# Patient Record
Sex: Male | Born: 1970
Health system: Southern US, Community
[De-identification: ages and names within clinical notes are randomized; demographics above are authoritative.]

## PROBLEM LIST (undated history)

## (undated) DIAGNOSIS — A6000 Herpesviral infection of urogenital system, unspecified: Secondary | ICD-10-CM

## (undated) DIAGNOSIS — E7800 Pure hypercholesterolemia, unspecified: Secondary | ICD-10-CM

## (undated) DIAGNOSIS — Z8619 Personal history of other infectious and parasitic diseases: Secondary | ICD-10-CM

## (undated) HISTORY — DX: Herpesviral infection of urogenital system, unspecified: A60.00

## (undated) HISTORY — DX: Personal history of other infectious and parasitic diseases: Z86.19

## (undated) HISTORY — PX: CARPAL TUNNEL WITH CUBITAL TUNNEL: SHX5608

## (undated) HISTORY — PX: VASECTOMY: SHX75

---

## 2017-02-22 ENCOUNTER — Encounter: Payer: Self-pay | Admitting: Internal Medicine

## 2017-02-22 ENCOUNTER — Ambulatory Visit: Payer: 59 | Admitting: Internal Medicine

## 2017-02-22 VITALS — BP 126/88 | HR 73 | Temp 97.9°F | Ht 67.75 in | Wt 210.0 lb

## 2017-02-22 DIAGNOSIS — B029 Zoster without complications: Secondary | ICD-10-CM | POA: Diagnosis not present

## 2017-02-22 DIAGNOSIS — A6001 Herpesviral infection of penis: Secondary | ICD-10-CM | POA: Diagnosis not present

## 2017-02-22 NOTE — Assessment & Plan Note (Signed)
Rare outbreak He is not interested in suppressive therapy at this time

## 2017-02-22 NOTE — Progress Notes (Signed)
HPI  Pt presents to the clinic today to establish care and for management of the conditions listed below. He has not had a PCP in many years.  Genital Herpes: His last outbreak was over 1 year ago. He is not taking Valtrex for suppressive therapy at this time.  He noticed a rash on his left forearm. He noticed this 3-4 weeks ago. The rash itched mildly but did not tingle or burn. He did have exposure to a person with shingles. He uses Calamine lotion with minimal relief.   Flu: never Tetanus: 04/2016 Vision Screening: every 2 years Dentist: as needed   Past Medical History:  Diagnosis Date  . Herpes, genital   . History of chicken pox     Current Outpatient Medications  Medication Sig Dispense Refill  . Cyanocobalamin (VITAMIN B-12) 500 MCG SUBL Place 1 tablet under the tongue daily.    . Multiple Vitamins-Minerals (MULTIVITAMIN ADULT PO) Take 1 tablet by mouth daily.     No current facility-administered medications for this visit.     Allergies  Allergen Reactions  . Penicillins Anaphylaxis    Family History  Problem Relation Age of Onset  . Lung cancer Mother   . Diabetes Father   . Hyperlipidemia Father   . Heart disease Father   . Heart disease Maternal Grandmother   . Breast cancer Maternal Grandmother   . Heart disease Paternal Grandmother   . Diabetes Paternal Grandmother   . Heart disease Paternal Grandfather     Social History   Socioeconomic History  . Marital status: Married    Spouse name: Not on file  . Number of children: Not on file  . Years of education: Not on file  . Highest education level: Not on file  Social Needs  . Financial resource strain: Not on file  . Food insecurity - worry: Not on file  . Food insecurity - inability: Not on file  . Transportation needs - medical: Not on file  . Transportation needs - non-medical: Not on file  Occupational History  . Not on file  Tobacco Use  . Smoking status: Never Smoker  . Smokeless  tobacco: Never Used  Substance and Sexual Activity  . Alcohol use: Yes    Comment: occasional  . Drug use: No  . Sexual activity: Not on file  Other Topics Concern  . Not on file  Social History Narrative  . Not on file    ROS:  Constitutional: Denies fever, malaise, fatigue, headache or abrupt weight changes.  HEENT: Denies eye pain, eye redness, ear pain, ringing in the ears, wax buildup, runny nose, nasal congestion, bloody nose, or sore throat. Respiratory: Denies difficulty breathing, shortness of breath, cough or sputum production.   Cardiovascular: Denies chest pain, chest tightness, palpitations or swelling in the hands or feet.  Gastrointestinal: Denies abdominal pain, bloating, constipation, diarrhea or blood in the stool.  GU: Denies frequency, urgency, pain with urination, blood in urine, odor or discharge. Musculoskeletal: Denies decrease in range of motion, difficulty with gait, muscle pain or joint pain and swelling.  Skin: Pt reports rash, left forearm. Denies ulcercations.  Neurological: Denies dizziness, difficulty with memory, difficulty with speech or problems with balance and coordination.  Psych: Denies anxiety, depression, SI/HI.  No other specific complaints in a complete review of systems (except as listed in HPI above).  PE:  BP 126/88   Pulse 73   Temp 97.9 F (36.6 C) (Oral)   Ht 5' 7.75" (1.721 m)  Wt 210 lb (95.3 kg)   SpO2 98%   BMI 32.17 kg/m   Wt Readings from Last 3 Encounters:  02/22/17 210 lb (95.3 kg)    General: Appears his stated age, obese, in NAD. Skin: Scabbed lesion not on left forearm. He showed me a picture of what it looked like early on. C/w shingles. Cardiovascular: Normal rate and rhythm. S1,S2 noted.  No murmur, rubs or gallops noted.  Pulmonary/Chest: Normal effort and positive vesicular breath sounds. No respiratory distress. No wheezes, rales or ronchi noted.  Neurological: Alert and oriented.  Psychiatric: Mood and  affect normal. Behavior is normal. Judgment and thought content normal.     Assessment and Plan:  Shingles:  Resolving No treatment needed at this time Advised him to consider shingles vaccine at age 47  Make an appt for your annual exam Nicki ReaperBAITY, Shakeeta Godette, NP

## 2017-02-22 NOTE — Patient Instructions (Signed)

## 2017-03-13 ENCOUNTER — Ambulatory Visit (INDEPENDENT_AMBULATORY_CARE_PROVIDER_SITE_OTHER): Payer: 59 | Admitting: Internal Medicine

## 2017-03-13 ENCOUNTER — Encounter: Payer: Self-pay | Admitting: Internal Medicine

## 2017-03-13 VITALS — BP 124/76 | HR 76 | Temp 97.9°F | Ht 67.75 in | Wt 210.0 lb

## 2017-03-13 DIAGNOSIS — Z114 Encounter for screening for human immunodeficiency virus [HIV]: Secondary | ICD-10-CM | POA: Diagnosis not present

## 2017-03-13 DIAGNOSIS — Z23 Encounter for immunization: Secondary | ICD-10-CM | POA: Diagnosis not present

## 2017-03-13 DIAGNOSIS — Z0000 Encounter for general adult medical examination without abnormal findings: Secondary | ICD-10-CM

## 2017-03-13 LAB — LIPID PANEL
CHOLESTEROL: 287 mg/dL — AB (ref 0–200)
HDL: 45.9 mg/dL (ref >39.00)
LDL Cholesterol: 219 mg/dL — ABNORMAL HIGH (ref 0–99)
NONHDL: 241.47
Total CHOL/HDL Ratio: 6
Triglycerides: 112 mg/dL (ref 0.0–149.0)
VLDL: 22.4 mg/dL (ref 0.0–40.0)

## 2017-03-13 LAB — CBC
HCT: 48.2 % (ref 39.0–52.0)
Hemoglobin: 16.5 g/dL (ref 13.0–17.0)
MCHC: 34.3 g/dL (ref 30.0–36.0)
MCV: 89.3 fl (ref 78.0–100.0)
Platelets: 231 10*3/uL (ref 150.0–400.0)
RBC: 5.4 Mil/uL (ref 4.22–5.81)
RDW: 13.3 % (ref 11.5–15.5)
WBC: 6.6 10*3/uL (ref 4.0–10.5)

## 2017-03-13 LAB — COMPREHENSIVE METABOLIC PANEL
ALBUMIN: 4.5 g/dL (ref 3.5–5.2)
ALK PHOS: 64 U/L (ref 39–117)
ALT: 31 U/L (ref 0–53)
AST: 24 U/L (ref 0–37)
BILIRUBIN TOTAL: 0.6 mg/dL (ref 0.2–1.2)
BUN: 14 mg/dL (ref 6–23)
CO2: 30 mEq/L (ref 19–32)
Calcium: 10.2 mg/dL (ref 8.4–10.5)
Chloride: 102 mEq/L (ref 96–112)
Creatinine, Ser: 0.99 mg/dL (ref 0.40–1.50)
GFR: 85.9 mL/min (ref >60.00)
GLUCOSE: 108 mg/dL — AB (ref 70–99)
Potassium: 4.4 mEq/L (ref 3.5–5.1)
SODIUM: 138 meq/L (ref 135–145)
TOTAL PROTEIN: 7.8 g/dL (ref 6.0–8.3)

## 2017-03-13 LAB — HEMOGLOBIN A1C: HEMOGLOBIN A1C: 5.5 % (ref 4.6–6.5)

## 2017-03-13 NOTE — Progress Notes (Signed)
Subjective:    Patient ID: Joseph Salazar, male    DOB: 01/16/1971, 47 y.o.   MRN: [id number]  HPI  Pt presents to the clinic today for his annual exam.  Flu: never Tetanus: 03/2016 Vision Screening: as needed Dentist: as needed  Diet: He does eat meat. He consumes some fruits and veggies. He does eat fried foods. He drinks mostly water and coffee. Exercise: None  Review of Systems      Past Medical History:  Diagnosis Date  . Herpes, genital   . History of chicken pox     Current Outpatient Medications  Medication Sig Dispense Refill  . Cyanocobalamin (VITAMIN B-12) 500 MCG SUBL Place 1 tablet under the tongue daily.    . Multiple Vitamins-Minerals (MULTIVITAMIN ADULT PO) Take 1 tablet by mouth daily.     No current facility-administered medications for this visit.     Allergies  Allergen Reactions  . Penicillins Anaphylaxis    Family History  Problem Relation Age of Onset  . Lung cancer Mother   . Diabetes Father   . Hyperlipidemia Father   . Heart disease Father   . Heart disease Maternal Grandmother   . Breast cancer Maternal Grandmother   . Heart disease Paternal Grandmother   . Diabetes Paternal Grandmother   . Heart disease Paternal Grandfather     Social History   Socioeconomic History  . Marital status: Married    Spouse name: Not on file  . Number of children: Not on file  . Years of education: Not on file  . Highest education level: Not on file  Social Needs  . Financial resource strain: Not on file  . Food insecurity - worry: Not on file  . Food insecurity - inability: Not on file  . Transportation needs - medical: Not on file  . Transportation needs - non-medical: Not on file  Occupational History  . Not on file  Tobacco Use  . Smoking status: Never Smoker  . Smokeless tobacco: Never Used  Substance and Sexual Activity  . Alcohol use: Yes    Comment: occasional  . Drug use: No  . Sexual activity: Not on file  Other Topics  Concern  . Not on file  Social History Narrative  . Not on file     Constitutional: Denies fever, malaise, fatigue, headache or abrupt weight changes.  HEENT: Denies eye pain, eye redness, ear pain, ringing in the ears, wax buildup, runny nose, nasal congestion, bloody nose, or sore throat. Respiratory: Denies difficulty breathing, shortness of breath, cough or sputum production.   Cardiovascular: Denies chest pain, chest tightness, palpitations or swelling in the hands or feet.  Gastrointestinal: Denies abdominal pain, bloating, constipation, diarrhea or blood in the stool.  GU: Denies urgency, frequency, pain with urination, burning sensation, blood in urine, odor or discharge. Musculoskeletal: Denies decrease in range of motion, difficulty with gait, muscle pain or joint pain and swelling.  Skin: Denies redness, rashes, lesions or ulcercations.  Neurological: Denies dizziness, difficulty with memory, difficulty with speech or problems with balance and coordination.  Psych: Denies anxiety, depression, SI/HI.  No other specific complaints in a complete review of systems (except as listed in HPI above).  Objective:   Physical Exam   BP 124/76   Pulse 76   Temp 97.9 F (36.6 C) (Oral)   Ht 5' 7.75" (1.721 m)   Wt 210 lb (95.3 kg)   SpO2 98%   BMI 32.17 kg/m  Wt Readings from Last  3 Encounters:  03/13/17 210 lb (95.3 kg)  02/22/17 210 lb (95.3 kg)    General: Appears his stated age, obese in NAD. Skin: Warm, dry and intact.  HEENT: Head: normal shape and size; Eyes: sclera white, no icterus, conjunctiva pink, PERRLA and EOMs intact; Ears: Tm's gray and intact, normal light reflex; Nose: mucosa pink and moist, septum midline; Throat/Mouth: Teeth present, mucosa pink and moist, no exudate, lesions or ulcerations noted.  Neck:  Neck supple, trachea midline. No masses, lumps or thyromegaly present.  Cardiovascular: Normal rate and rhythm. S1,S2 noted.  No murmur, rubs or gallops  noted. No JVD or BLE edema.  Pulmonary/Chest: Normal effort and positive vesicular breath sounds. No respiratory distress. No wheezes, rales or ronchi noted.  Abdomen: Soft and nontender. Normal bowel sounds. No distention or masses noted. Liver, spleen and kidneys non palpable. Musculoskeletal: Strength 5/5 BUE/BLE. No difficulty with gait.  Neurological: Alert and oriented. Cranial nerves II-XII grossly intact. Coordination normal.  Psychiatric: Mood and affect normal. Behavior is normal. Judgment and thought content normal.           Assessment & Plan:   Preventative Health Maintenance:  Flu shot today Tetanus UTD Encouraged him to consume a balanced diet an exercise regimen Advised him to see an eye doctor and dentist annually Will check CBC, CMET, Lipid, A1C and Vit D today  RTC in 1 year for your annual exam Nicki Reaper, NP

## 2017-03-13 NOTE — Addendum Note (Signed)
Addended by: Roena MaladyEVONTENNO, Muhamed Luecke Y on: 03/13/2017 03:44 PM   Modules accepted: Orders

## 2017-03-13 NOTE — Patient Instructions (Signed)

## 2017-03-14 LAB — HIV ANTIBODY (ROUTINE TESTING W REFLEX): HIV 1&2 Ab, 4th Generation: NONREACTIVE

## 2017-03-19 ENCOUNTER — Other Ambulatory Visit: Payer: Self-pay | Admitting: Internal Medicine

## 2017-03-19 DIAGNOSIS — E7800 Pure hypercholesterolemia, unspecified: Secondary | ICD-10-CM

## 2017-03-19 MED ORDER — ATORVASTATIN CALCIUM 20 MG PO TABS
20.0000 mg | ORAL_TABLET | Freq: Every day | ORAL | 1 refills | Status: DC
Start: 2017-03-19 — End: 2017-09-23

## 2017-07-24 DIAGNOSIS — L299 Pruritus, unspecified: Secondary | ICD-10-CM | POA: Diagnosis not present

## 2017-07-24 DIAGNOSIS — Z8619 Personal history of other infectious and parasitic diseases: Secondary | ICD-10-CM | POA: Diagnosis not present

## 2017-07-24 DIAGNOSIS — L0100 Impetigo, unspecified: Secondary | ICD-10-CM | POA: Diagnosis not present

## 2017-07-24 DIAGNOSIS — L259 Unspecified contact dermatitis, unspecified cause: Secondary | ICD-10-CM | POA: Diagnosis not present

## 2017-09-23 ENCOUNTER — Other Ambulatory Visit: Payer: Self-pay | Admitting: Internal Medicine

## 2017-09-23 DIAGNOSIS — E7800 Pure hypercholesterolemia, unspecified: Secondary | ICD-10-CM

## 2017-12-21 ENCOUNTER — Other Ambulatory Visit: Payer: Self-pay | Admitting: Internal Medicine

## 2017-12-21 DIAGNOSIS — E7800 Pure hypercholesterolemia, unspecified: Secondary | ICD-10-CM

## 2018-03-21 ENCOUNTER — Encounter: Payer: 59 | Admitting: Internal Medicine

## 2018-03-26 ENCOUNTER — Encounter: Payer: Self-pay | Admitting: Internal Medicine

## 2018-03-26 ENCOUNTER — Ambulatory Visit (INDEPENDENT_AMBULATORY_CARE_PROVIDER_SITE_OTHER): Payer: 59 | Admitting: Internal Medicine

## 2018-03-26 VITALS — BP 124/82 | HR 66 | Temp 97.8°F | Ht 68.0 in | Wt 217.0 lb

## 2018-03-26 DIAGNOSIS — Z0001 Encounter for general adult medical examination with abnormal findings: Secondary | ICD-10-CM | POA: Diagnosis not present

## 2018-03-26 DIAGNOSIS — E7800 Pure hypercholesterolemia, unspecified: Secondary | ICD-10-CM

## 2018-03-26 DIAGNOSIS — E785 Hyperlipidemia, unspecified: Secondary | ICD-10-CM | POA: Insufficient documentation

## 2018-03-26 LAB — CBC
HCT: 48.5 % (ref 39.0–52.0)
HEMOGLOBIN: 16.7 g/dL (ref 13.0–17.0)
MCHC: 34.5 g/dL (ref 30.0–36.0)
MCV: 89.8 fl (ref 78.0–100.0)
PLATELETS: 243 10*3/uL (ref 150.0–400.0)
RBC: 5.4 Mil/uL (ref 4.22–5.81)
RDW: 13.7 % (ref 11.5–15.5)
WBC: 7 10*3/uL (ref 4.0–10.5)

## 2018-03-26 LAB — LIPID PANEL
Cholesterol: 214 mg/dL — ABNORMAL HIGH (ref 0–200)
HDL: 47.9 mg/dL (ref >39.00)
LDL Cholesterol: 144 mg/dL — ABNORMAL HIGH (ref 0–99)
NONHDL: 166.54
TRIGLYCERIDES: 113 mg/dL (ref 0.0–149.0)
Total CHOL/HDL Ratio: 4
VLDL: 22.6 mg/dL (ref 0.0–40.0)

## 2018-03-26 LAB — COMPREHENSIVE METABOLIC PANEL
ALK PHOS: 71 U/L (ref 39–117)
ALT: 41 U/L (ref 0–53)
AST: 26 U/L (ref 0–37)
Albumin: 4.7 g/dL (ref 3.5–5.2)
BILIRUBIN TOTAL: 0.6 mg/dL (ref 0.2–1.2)
BUN: 22 mg/dL (ref 6–23)
CALCIUM: 9.4 mg/dL (ref 8.4–10.5)
CO2: 27 mEq/L (ref 19–32)
CREATININE: 1 mg/dL (ref 0.40–1.50)
Chloride: 104 mEq/L (ref 96–112)
GFR: 79.54 mL/min (ref >60.00)
GLUCOSE: 95 mg/dL (ref 70–99)
Potassium: 4.6 mEq/L (ref 3.5–5.1)
Sodium: 137 mEq/L (ref 135–145)
TOTAL PROTEIN: 7.7 g/dL (ref 6.0–8.3)

## 2018-03-26 LAB — HEMOGLOBIN A1C: Hgb A1c MFr Bld: 5.5 % (ref 4.6–6.5)

## 2018-03-26 NOTE — Patient Instructions (Signed)

## 2018-03-26 NOTE — Assessment & Plan Note (Signed)
CMET and lipid profile today Encouraged him to consume a low fat diet Continue Atorvastatin, will adjust if needed based on labs

## 2018-03-26 NOTE — Progress Notes (Addendum)
Subjective:    Patient ID: Joseph Salazar, male    DOB: April 25, 1970, 48 y.o.   MRN: [id number]  HPI  Pt presents to the clinic today for his annual exam.  HLD: His last LDL was 219, 02/2017. He denies myalgias on Atorvastatin. He tries to consume a low fat diet.  Flu: 02/2017 Tetanus: 03/2016 Vision Screening: every 1-2 years Dentist: as needed  Diet: He does eat meat. He consumes some fruits and veggies daily. He tries to avoid fried foods. He drinks mostly coffee and water. Exercise: None  Review of Systems .  Past Medical History:  Diagnosis Date  . Herpes, genital   . History of chicken pox     Current Outpatient Medications  Medication Sig Dispense Refill  . atorvastatin (LIPITOR) 20 MG tablet Take 1 tablet (20 mg total) by mouth daily. 90 tablet 0  . Cyanocobalamin (VITAMIN B-12) 500 MCG SUBL Place 1 tablet under the tongue daily.    . Multiple Vitamins-Minerals (MULTIVITAMIN ADULT PO) Take 1 tablet by mouth daily.     No current facility-administered medications for this visit.     Allergies  Allergen Reactions  . Penicillins Anaphylaxis    Family History  Problem Relation Age of Onset  . Lung cancer Mother   . Diabetes Father   . Hyperlipidemia Father   . Heart disease Father   . Heart disease Maternal Grandmother   . Breast cancer Maternal Grandmother   . Heart disease Paternal Grandmother   . Diabetes Paternal Grandmother   . Heart disease Paternal Grandfather     Social History   Socioeconomic History  . Marital status: Married    Spouse name: Not on file  . Number of children: Not on file  . Years of education: Not on file  . Highest education level: Not on file  Occupational History  . Not on file  Social Needs  . Financial resource strain: Not on file  . Food insecurity:    Worry: Not on file    Inability: Not on file  . Transportation needs:    Medical: Not on file    Non-medical: Not on file  Tobacco Use  . Smoking status:  Never Smoker  . Smokeless tobacco: Never Used  Substance and Sexual Activity  . Alcohol use: Yes    Comment: occasional  . Drug use: No  . Sexual activity: Not on file  Lifestyle  . Physical activity:    Days per week: Not on file    Minutes per session: Not on file  . Stress: Not on file  Relationships  . Social connections:    Talks on phone: Not on file    Gets together: Not on file    Attends religious service: Not on file    Active member of club or organization: Not on file    Attends meetings of clubs or organizations: Not on file    Relationship status: Not on file  . Intimate partner violence:    Fear of current or ex partner: Not on file    Emotionally abused: Not on file    Physically abused: Not on file    Forced sexual activity: Not on file  Other Topics Concern  . Not on file  Social History Narrative  . Not on file     Constitutional: Denies fever, malaise, fatigue, headache or abrupt weight changes.  HEENT: Denies eye pain, eye redness, ear pain, ringing in the ears, wax buildup, runny nose, nasal  congestion, bloody nose, or sore throat. Respiratory: Denies difficulty breathing, shortness of breath, cough or sputum production.   Cardiovascular: Denies chest pain, chest tightness, palpitations or swelling in the hands or feet.  Gastrointestinal: Denies abdominal pain, bloating, constipation, diarrhea or blood in the stool.  GU: Denies urgency, frequency, pain with urination, burning sensation, blood in urine, odor or discharge. Musculoskeletal: Denies decrease in range of motion, difficulty with gait, muscle pain or joint pain and swelling.  Skin: Denies redness, rashes, lesions or ulcercations.  Neurological: Denies dizziness, difficulty with memory, difficulty with speech or problems with balance and coordination.  Psych: Denies anxiety, depression, SI/HI.  No other specific complaints in a complete review of systems (except as listed in HPI  above).  Objective:   Physical Exam  BP 124/82   Pulse 66   Temp 97.8 F (36.6 C) (Oral)   Ht 5\' 8"  (1.727 m)   Wt 217 lb (98.4 kg)   SpO2 98%   BMI 32.99 kg/m  Wt Readings from Last 3 Encounters:  03/26/18 217 lb (98.4 kg)  03/13/17 210 lb (95.3 kg)  02/22/17 210 lb (95.3 kg)    General: Appears their stated age, well developed, well nourished in NAD. Skin: Warm, dry and intact. Dermatofibroma noted of right thigh. HEENT: Head: normal shape and size; Eyes: sclera white, no icterus, conjunctiva pink, PERRLA and EOMs intact; Ears: Tm's gray and intact, normal light reflex; Nose: mucosa pink and moist, septum midline; Throat/Mouth: Teeth present, mucosa pink and moist, no exudate, lesions or ulcerations noted.  Neck:  Neck supple, trachea midline. No masses, lumps or thyromegaly present.  Cardiovascular: Normal rate and rhythm. S1,S2 noted.  No murmur, rubs or gallops noted. No JVD or BLE edema. No carotid bruits noted. Pulmonary/Chest: Normal effort and positive vesicular breath sounds. No respiratory distress. No wheezes, rales or ronchi noted.  Abdomen: Soft and nontender. Normal bowel sounds. No distention or masses noted. Liver, spleen and kidneys non palpable. Musculoskeletal: Normal range of motion. No signs of joint swelling. No difficulty with gait.  Neurological: Alert and oriented. Cranial nerves II-XII grossly intact. Coordination normal.  Psychiatric: Mood and affect normal. Behavior is normal. Judgment and thought content normal.   EKG:  BMET    Component Value Date/Time   NA 138 03/13/2017 0948   K 4.4 03/13/2017 0948   CL 102 03/13/2017 0948   CO2 30 03/13/2017 0948   GLUCOSE 108 (H) 03/13/2017 0948   BUN 14 03/13/2017 0948   CREATININE 0.99 03/13/2017 0948   CALCIUM 10.2 03/13/2017 0948    Lipid Panel     Component Value Date/Time   CHOL 287 (H) 03/13/2017 0948   TRIG 112.0 03/13/2017 0948   HDL 45.90 03/13/2017 0948   CHOLHDL 6 03/13/2017 0948    VLDL 22.4 03/13/2017 0948   LDLCALC 219 (H) 03/13/2017 0948    CBC    Component Value Date/Time   WBC 6.6 03/13/2017 0948   RBC 5.40 03/13/2017 0948   HGB 16.5 03/13/2017 0948   HCT 48.2 03/13/2017 0948   PLT 231.0 03/13/2017 0948   MCV 89.3 03/13/2017 0948   MCHC 34.3 03/13/2017 0948   RDW 13.3 03/13/2017 0948    Hgb A1C Lab Results  Component Value Date   HGBA1C 5.5 03/13/2017            Assessment & Plan:

## 2018-03-27 MED ORDER — TRIAMCINOLONE ACETONIDE 0.1 % EX CREA: 1.0000 "application " | TOPICAL_CREAM | Freq: Two times a day (BID) | CUTANEOUS | 0 refills | Status: DC

## 2018-03-29 ENCOUNTER — Other Ambulatory Visit: Payer: Self-pay | Admitting: Internal Medicine

## 2018-03-29 DIAGNOSIS — E7800 Pure hypercholesterolemia, unspecified: Secondary | ICD-10-CM

## 2018-04-02 ENCOUNTER — Encounter: Payer: Self-pay | Admitting: Internal Medicine

## 2018-04-02 MED ORDER — ATORVASTATIN CALCIUM 40 MG PO TABS: 40.0000 mg | ORAL_TABLET | Freq: Every day | ORAL | 0 refills | Status: DC

## 2018-04-11 NOTE — Addendum Note (Signed)
Addended by: Roena Malady on: 04/11/2018 04:51 PM   Modules accepted: Orders

## 2018-07-06 ENCOUNTER — Other Ambulatory Visit: Payer: Self-pay | Admitting: Internal Medicine

## 2018-07-28 ENCOUNTER — Other Ambulatory Visit: Payer: Self-pay | Admitting: Internal Medicine

## 2018-10-03 ENCOUNTER — Other Ambulatory Visit: Payer: Self-pay | Admitting: Internal Medicine

## 2018-10-04 NOTE — Telephone Encounter (Signed)
Patient stated that he has one tablet. He stated it was no rush but would like to sent in when you can

## 2018-10-08 ENCOUNTER — Other Ambulatory Visit (INDEPENDENT_AMBULATORY_CARE_PROVIDER_SITE_OTHER): Payer: 59

## 2018-10-08 DIAGNOSIS — E7800 Pure hypercholesterolemia, unspecified: Secondary | ICD-10-CM

## 2018-10-09 LAB — LIPID PANEL
Cholesterol: 197 mg/dL (ref 0–200)
HDL: 45.1 mg/dL (ref >39.00)
LDL Cholesterol: 122 mg/dL — ABNORMAL HIGH (ref 0–99)
NonHDL: 151.66
Total CHOL/HDL Ratio: 4
Triglycerides: 150 mg/dL — ABNORMAL HIGH (ref 0.0–149.0)
VLDL: 30 mg/dL (ref 0.0–40.0)

## 2018-10-23 ENCOUNTER — Encounter: Payer: Self-pay | Admitting: Internal Medicine

## 2018-10-24 MED ORDER — ATORVASTATIN CALCIUM 40 MG PO TABS: 40.0000 mg | ORAL_TABLET | Freq: Every day | ORAL | 1 refills | Status: DC

## 2019-02-11 ENCOUNTER — Telehealth: Payer: Self-pay | Admitting: Internal Medicine

## 2019-02-11 NOTE — Telephone Encounter (Signed)
Patient came in to the office to request refill and to change pharmacy. Patient would like to use the [facility] [address] in [place].

## 2019-02-12 MED ORDER — TRIAMCINOLONE ACETONIDE 0.1 % EX CREA: TOPICAL_CREAM | CUTANEOUS | 0 refills | Status: DC

## 2019-02-12 NOTE — Telephone Encounter (Signed)
Atorvastatin had a refill and Kenalog refill sent to the pharmacy

## 2019-03-31 ENCOUNTER — Encounter: Payer: 59 | Admitting: Internal Medicine

## 2019-04-09 ENCOUNTER — Encounter: Payer: Self-pay | Admitting: Internal Medicine

## 2019-04-09 NOTE — Telephone Encounter (Signed)
Message sent back to patient advising to call the office for an appt or to discuss concerns  With triage.   Will await message back from patient or phone call - in meantime will send this message to [facility] to follow up.

## 2019-05-05 ENCOUNTER — Encounter: Payer: 59 | Admitting: Internal Medicine

## 2019-05-12 ENCOUNTER — Other Ambulatory Visit: Payer: Self-pay

## 2019-05-12 ENCOUNTER — Encounter: Payer: Self-pay | Admitting: Internal Medicine

## 2019-05-12 ENCOUNTER — Ambulatory Visit (INDEPENDENT_AMBULATORY_CARE_PROVIDER_SITE_OTHER): Payer: Commercial Managed Care - PPO | Admitting: Internal Medicine

## 2019-05-12 VITALS — BP 120/84 | HR 93 | Temp 97.8°F | Ht 67.75 in | Wt 205.0 lb

## 2019-05-12 DIAGNOSIS — R251 Tremor, unspecified: Secondary | ICD-10-CM | POA: Diagnosis not present

## 2019-05-12 DIAGNOSIS — Z0000 Encounter for general adult medical examination without abnormal findings: Secondary | ICD-10-CM | POA: Diagnosis not present

## 2019-05-12 DIAGNOSIS — M8949 Other hypertrophic osteoarthropathy, multiple sites: Secondary | ICD-10-CM

## 2019-05-12 DIAGNOSIS — Z0001 Encounter for general adult medical examination with abnormal findings: Secondary | ICD-10-CM | POA: Diagnosis not present

## 2019-05-12 DIAGNOSIS — Z1211 Encounter for screening for malignant neoplasm of colon: Secondary | ICD-10-CM

## 2019-05-12 DIAGNOSIS — Z125 Encounter for screening for malignant neoplasm of prostate: Secondary | ICD-10-CM

## 2019-05-12 DIAGNOSIS — M159 Polyosteoarthritis, unspecified: Secondary | ICD-10-CM

## 2019-05-12 MED ORDER — TAMSULOSIN HCL 0.4 MG PO CAPS: 0.4000 mg | ORAL_CAPSULE | Freq: Every day | ORAL | 3 refills | Status: DC

## 2019-05-12 NOTE — Patient Instructions (Signed)

## 2019-05-12 NOTE — Progress Notes (Signed)
Subjective:    Patient ID: Joseph Salazar, male    DOB: [birth year], 49 y.o.   MRN: [id number]  HPI  Pt presents to the clinic today for his annual exam.  HLD: His last LDL was 122, triglycerides 150, 09/2018. He denies myalgias on Atorvastatin. He tries to consume a low fat diet.  He also reports tremors in his left hand. He noticed this 6-8 months ago. He has no family history of Parkinson's disease. He has not tried anything OTC for this.  He also reports bilateral ankle pain, L>R. He sprained his ankles 5-6 years ago.   Flu: 02/2017 Tetanus: 03/2016 PSA Screening: never Colon Screening: never Vision Screening: every 1-2 years Dentist:  As needed  Diet: He does eat meat. He consumes fruits and veggies daily. He tries to avoid fried foods. He drinks mostly water, coffee. Exercise: None Review of Systems      Past Medical History:  Diagnosis Date  . Herpes, genital   . History of chicken pox     Current Outpatient Medications  Medication Sig Dispense Refill  . atorvastatin (LIPITOR) 40 MG tablet Take 1 tablet (40 mg total) by mouth daily. 90 tablet 1  . Cyanocobalamin (VITAMIN B-12) 500 MCG SUBL Place 1 tablet under the tongue daily.    . Multiple Vitamins-Minerals (MULTIVITAMIN ADULT PO) Take 1 tablet by mouth daily.    Marland Kitchen triamcinolone cream (KENALOG) 0.1 % APPLY TO AFFECTED AREA TWICE A DAY 30 g 0   No current facility-administered medications for this visit.    Allergies  Allergen Reactions  . Penicillins Anaphylaxis    Family History  Problem Relation Age of Onset  . Lung cancer Mother   . Diabetes Father   . Hyperlipidemia Father   . Heart disease Father   . Heart disease Maternal Grandmother   . Breast cancer Maternal Grandmother   . Heart disease Paternal Grandmother   . Diabetes Paternal Grandmother   . Heart disease Paternal Grandfather     Social History   Socioeconomic History  . Marital status: Married    Spouse name: Not on file  .  Number of children: Not on file  . Years of education: Not on file  . Highest education level: Not on file  Occupational History  . Not on file  Tobacco Use  . Smoking status: Never Smoker  . Smokeless tobacco: Never Used  Substance and Sexual Activity  . Alcohol use: Yes    Comment: occasional  . Drug use: No  . Sexual activity: Not on file  Other Topics Concern  . Not on file  Social History Narrative  . Not on file   Social Determinants of Health   Financial Resource Strain:   . Difficulty of Paying Living Expenses:   Food Insecurity:   . Worried About Programme researcher, broadcasting/film/video in the Last Year:   . Barista in the Last Year:   Transportation Needs:   . Freight forwarder (Medical):   Marland Kitchen Lack of Transportation (Non-Medical):   Physical Activity:   . Days of Exercise per Week:   . Minutes of Exercise per Session:   Stress:   . Feeling of Stress :   Social Connections:   . Frequency of Communication with Friends and Family:   . Frequency of Social Gatherings with Friends and Family:   . Attends Religious Services:   . Active Member of Clubs or Organizations:   . Attends Banker Meetings:   .  Marital Status:   Intimate Partner Violence:   . Fear of Current or Ex-Partner:   . Emotionally Abused:   Marland Kitchen Physically Abused:   . Sexually Abused:      Constitutional: Denies fever, malaise, fatigue, headache or abrupt weight changes.  HEENT: Denies eye pain, eye redness, ear pain, ringing in the ears, wax buildup, runny nose, nasal congestion, bloody nose, or sore throat. Respiratory: Denies difficulty breathing, shortness of breath, cough or sputum production.   Cardiovascular: Denies chest pain, chest tightness, palpitations or swelling in the hands or feet.  Gastrointestinal: Denies abdominal pain, bloating, constipation, diarrhea or blood in the stool.  GU: Denies urgency, frequency, pain with urination, burning sensation, blood in urine, odor or  discharge. Musculoskeletal: Pt reports bilateral ankle and hand pain intermittently. Denies decrease in range of motion, difficulty with gait, muscle pain or joint swelling.  Skin: Denies redness, rashes, lesions or ulcercations.  Neurological: Pt reports tremor of left hand. Denies dizziness, difficulty with memory, difficulty with speech or problems with balance and coordination.  Psych: Denies anxiety, depression, SI/HI.  No other specific complaints in a complete review of systems (except as listed in HPI above).  Objective:   Physical Exam  BP 120/84   Pulse 93   Temp 97.8 F (36.6 C) (Temporal)   Ht 5' 7.75" (1.721 m)   Wt 205 lb (93 kg)   SpO2 97%   BMI 31.40 kg/m    Wt Readings from Last 3 Encounters:  03/26/18 217 lb (98.4 kg)  03/13/17 210 lb (95.3 kg)  02/22/17 210 lb (95.3 kg)    General: Appears his stated age, obese, in NAD. Skin: Warm, dry and intact. No rashesoted. HEENT: Head: normal shape and size; Eyes: sclera white, no icterus, conjunctiva pink, PERRLA and EOMs intact; Neck:  Neck supple, trachea midline. No masses, lumps or thyromegaly present.  Cardiovascular: Normal rate and rhythm. S1,S2 noted.  No murmur, rubs or gallops noted. No JVD or BLE edema. No carotid bruits noted. Pulmonary/Chest: Normal effort and positive vesicular breath sounds. No respiratory distress. No wheezes, rales or ronchi noted.  Abdomen: Soft and nontender. Normal bowel sounds. No distention or masses noted. Liver, spleen and kidneys non palpable. Musculoskeletal: Strength 5/5 BUE/BLE. No joint swelling noted. No difficulty with gait.  Neurological: Alert and oriented. Tremor noted of left hand.  Cranial nerves II-XII grossly intact. Coordination normal.  Psychiatric: Mood and affect normal. Behavior is normal. Judgment and thought content normal.    BMET    Component Value Date/Time   NA 137 03/26/2018 1011   K 4.6 03/26/2018 1011   CL 104 03/26/2018 1011   CO2 27  03/26/2018 1011   GLUCOSE 95 03/26/2018 1011   BUN 22 03/26/2018 1011   CREATININE 1.00 03/26/2018 1011   CALCIUM 9.4 03/26/2018 1011    Lipid Panel     Component Value Date/Time   CHOL 197 10/08/2018 1557   TRIG 150.0 (H) 10/08/2018 1557   HDL 45.10 10/08/2018 1557   CHOLHDL 4 10/08/2018 1557   VLDL 30.0 10/08/2018 1557   LDLCALC 122 (H) 10/08/2018 1557    CBC    Component Value Date/Time   WBC 7.0 03/26/2018 1011   RBC 5.40 03/26/2018 1011   HGB 16.7 03/26/2018 1011   HCT 48.5 03/26/2018 1011   PLT 243.0 03/26/2018 1011   MCV 89.8 03/26/2018 1011   MCHC 34.5 03/26/2018 1011   RDW 13.7 03/26/2018 1011    Hgb A1C Lab Results  Component  Value Date   HGBA1C 5.5 03/26/2018           Assessment & Plan:   Preventative Health Maintenance:  Encouraged him to get a flu shot in the fall Tetanus UTD Referral to GI for screening colonoscopy Encouraged him to consume a balanced diet and exercise regimen Advised him to see an eye doctor and dentist annually Will check CBC, CMET, Lipid, and PSA today  Tremor Left Hand:  Likely essential tremor- discussed medication management  He would prefer referral to neuro to r/o other conditions like Parkinson's  Bilateral Ankle and Hand Pain:  Likely OA Can try Tumeric OTC for inflammation and pain  RTC in 1 year, sooner if needed   Webb Silversmith, NP This visit occurred during the SARS-CoV-2 public health emergency.  Safety protocols were in place, including screening questions prior to the visit, additional usage of staff PPE, and extensive cleaning of exam room while observing appropriate contact time as indicated for disinfecting solutions.

## 2019-05-13 LAB — COMPREHENSIVE METABOLIC PANEL
ALT: 25 U/L (ref 0–53)
AST: 25 U/L (ref 0–37)
Albumin: 4.7 g/dL (ref 3.5–5.2)
Alkaline Phosphatase: 80 U/L (ref 39–117)
BUN: 13 mg/dL (ref 6–23)
CO2: 30 mEq/L (ref 19–32)
Calcium: 9.3 mg/dL (ref 8.4–10.5)
Chloride: 103 mEq/L (ref 96–112)
Creatinine, Ser: 1.15 mg/dL (ref 0.40–1.50)
GFR: 67.38 mL/min (ref >60.00)
Glucose, Bld: 80 mg/dL (ref 70–99)
Potassium: 4.2 mEq/L (ref 3.5–5.1)
Sodium: 140 mEq/L (ref 135–145)
Total Bilirubin: 0.8 mg/dL (ref 0.2–1.2)
Total Protein: 7.2 g/dL (ref 6.0–8.3)

## 2019-05-13 LAB — LIPID PANEL
Cholesterol: 181 mg/dL (ref 0–200)
HDL: 39.5 mg/dL (ref >39.00)
LDL Cholesterol: 105 mg/dL — ABNORMAL HIGH (ref 0–99)
NonHDL: 141.72
Total CHOL/HDL Ratio: 5
Triglycerides: 183 mg/dL — ABNORMAL HIGH (ref 0.0–149.0)
VLDL: 36.6 mg/dL (ref 0.0–40.0)

## 2019-05-13 LAB — CBC
HCT: 47.4 % (ref 39.0–52.0)
Hemoglobin: 16.1 g/dL (ref 13.0–17.0)
MCHC: 34.1 g/dL (ref 30.0–36.0)
MCV: 91.5 fl (ref 78.0–100.0)
Platelets: 243 10*3/uL (ref 150.0–400.0)
RBC: 5.18 Mil/uL (ref 4.22–5.81)
RDW: 13.3 % (ref 11.5–15.5)
WBC: 8.1 10*3/uL (ref 4.0–10.5)

## 2019-05-13 LAB — PSA: PSA: 1.56 ng/mL (ref 0.10–4.00)

## 2019-05-19 ENCOUNTER — Other Ambulatory Visit: Payer: Self-pay

## 2019-05-19 ENCOUNTER — Telehealth (INDEPENDENT_AMBULATORY_CARE_PROVIDER_SITE_OTHER): Payer: Self-pay | Admitting: Gastroenterology

## 2019-05-19 DIAGNOSIS — Z1211 Encounter for screening for malignant neoplasm of colon: Secondary | ICD-10-CM

## 2019-05-19 DIAGNOSIS — S93409A Sprain of unspecified ligament of unspecified ankle, initial encounter: Secondary | ICD-10-CM | POA: Insufficient documentation

## 2019-05-19 NOTE — Progress Notes (Signed)
Gastroenterology Pre-Procedure Review  Request Date: Monday 06/09/19 Requesting Physician: Dr. Tobi Bastos  PATIENT REVIEW QUESTIONS: The patient responded to the following health history questions as indicated:    1. Are you having any GI issues? no 2. Do you have a personal history of Polyps? no 3. Do you have a family history of Colon Cancer or Polyps? no 4. Diabetes Mellitus? no 5. Joint replacements in the past 12 months?no 6. Major health problems in the past 3 months?no 7. Any artificial heart valves, MVP, or defibrillator?no    MEDICATIONS & ALLERGIES:    Patient reports the following regarding taking any anticoagulation/antiplatelet therapy:   Plavix, Coumadin, Eliquis, Xarelto, Lovenox, Pradaxa, Brilinta, or Effient? no Aspirin? no  Patient confirms/reports the following medications:  Current Outpatient Medications  Medication Sig Dispense Refill  . atorvastatin (LIPITOR) 40 MG tablet Take 1 tablet (40 mg total) by mouth daily. 90 tablet 1  . Cyanocobalamin (VITAMIN B-12) 500 MCG SUBL Place 1 tablet under the tongue daily. 100 mcg    . Multiple Vitamins-Minerals (MULTIVITAMIN ADULT PO) Take 1 tablet by mouth daily.    . tamsulosin (FLOMAX) 0.4 MG CAPS capsule Take 1 capsule (0.4 mg total) by mouth daily. 90 capsule 3  . triamcinolone cream (KENALOG) 0.1 % APPLY TO AFFECTED AREA TWICE A DAY 30 g 0   No current facility-administered medications for this visit.    Patient confirms/reports the following allergies:  Allergies  Allergen Reactions  . Penicillins Anaphylaxis    No orders of the defined types were placed in this encounter.   AUTHORIZATION INFORMATION Primary Insurance: 1D#: Group #:  Secondary Insurance: 1D#: Group #:  SCHEDULE INFORMATION: Date: Monday 05/24/21Time: Location:[facility]

## 2019-06-03 ENCOUNTER — Other Ambulatory Visit: Payer: Self-pay

## 2019-06-03 DIAGNOSIS — Z1211 Encounter for screening for malignant neoplasm of colon: Secondary | ICD-10-CM

## 2019-06-19 ENCOUNTER — Other Ambulatory Visit: Payer: Self-pay

## 2019-06-19 ENCOUNTER — Other Ambulatory Visit
Admission: RE | Admit: 2019-06-19 | Discharge: 2019-06-19 | Disposition: A | Discharge status: Home / Self Care | Payer: Commercial Managed Care - PPO | Source: Ambulatory Visit | Attending: Gastroenterology | Admitting: Gastroenterology

## 2019-06-19 DIAGNOSIS — Z20822 Contact with and (suspected) exposure to covid-19: Secondary | ICD-10-CM | POA: Diagnosis not present

## 2019-06-19 DIAGNOSIS — Z01812 Encounter for preprocedural laboratory examination: Secondary | ICD-10-CM | POA: Diagnosis present

## 2019-06-19 LAB — SARS CORONAVIRUS 2 (TAT 6-24 HRS): SARS Coronavirus 2: NEGATIVE

## 2019-06-23 ENCOUNTER — Other Ambulatory Visit: Payer: Self-pay

## 2019-06-23 ENCOUNTER — Encounter
Admission: RE | Disposition: A | Discharge status: Home / Self Care | Payer: Self-pay | Source: Home / Self Care | Attending: Gastroenterology

## 2019-06-23 ENCOUNTER — Ambulatory Visit
Admission: RE | Admit: 2019-06-23 | Discharge: 2019-06-23 | Disposition: A | Discharge status: Home / Self Care | Payer: Commercial Managed Care - PPO | Attending: Gastroenterology | Admitting: Gastroenterology

## 2019-06-23 ENCOUNTER — Encounter: Payer: Self-pay | Admitting: Gastroenterology

## 2019-06-23 ENCOUNTER — Ambulatory Visit: Payer: Commercial Managed Care - PPO | Admitting: Anesthesiology

## 2019-06-23 DIAGNOSIS — Z880 Allergy status to penicillin: Secondary | ICD-10-CM | POA: Diagnosis not present

## 2019-06-23 DIAGNOSIS — Z801 Family history of malignant neoplasm of trachea, bronchus and lung: Secondary | ICD-10-CM | POA: Diagnosis not present

## 2019-06-23 DIAGNOSIS — Z803 Family history of malignant neoplasm of breast: Secondary | ICD-10-CM | POA: Diagnosis not present

## 2019-06-23 DIAGNOSIS — Z8249 Family history of ischemic heart disease and other diseases of the circulatory system: Secondary | ICD-10-CM | POA: Insufficient documentation

## 2019-06-23 DIAGNOSIS — Z5309 Procedure and treatment not carried out because of other contraindication: Secondary | ICD-10-CM | POA: Diagnosis not present

## 2019-06-23 DIAGNOSIS — Z1211 Encounter for screening for malignant neoplasm of colon: Secondary | ICD-10-CM | POA: Insufficient documentation

## 2019-06-23 DIAGNOSIS — Z833 Family history of diabetes mellitus: Secondary | ICD-10-CM | POA: Diagnosis not present

## 2019-06-23 DIAGNOSIS — Z79899 Other long term (current) drug therapy: Secondary | ICD-10-CM | POA: Insufficient documentation

## 2019-06-23 DIAGNOSIS — E7800 Pure hypercholesterolemia, unspecified: Secondary | ICD-10-CM | POA: Diagnosis not present

## 2019-06-23 HISTORY — DX: Pure hypercholesterolemia, unspecified: E78.00

## 2019-06-23 HISTORY — PX: COLONOSCOPY WITH PROPOFOL: SHX5780

## 2019-06-23 SURGERY — COLONOSCOPY WITH PROPOFOL
Anesthesia: General

## 2019-06-23 MED ORDER — PROPOFOL 10 MG/ML IV BOLUS
INTRAVENOUS | Status: DC | PRN
  Administered 2019-06-23: 80 mg via INTRAVENOUS

## 2019-06-23 MED ORDER — LIDOCAINE HCL (PF) 1 % IJ SOLN
INTRAMUSCULAR | Status: AC
  Administered 2019-06-23: 0.2 mL
  Filled 2019-06-23: qty 2

## 2019-06-23 MED ORDER — SODIUM CHLORIDE 0.9 % IV SOLN
INTRAVENOUS | Status: DC
  Administered 2019-06-23: 1000 mL via INTRAVENOUS

## 2019-06-23 MED ORDER — PROPOFOL 500 MG/50ML IV EMUL
INTRAVENOUS | Status: DC | PRN
  Administered 2019-06-23: 140 ug/kg/min via INTRAVENOUS

## 2019-06-23 NOTE — Transfer of Care (Signed)
Immediate Anesthesia Transfer of Care Note  Patient: Joseph Salazar [facility]  Procedure(s) Performed: COLONOSCOPY WITH PROPOFOL (N/A )  Patient Location: PACU  Anesthesia Type:General  Level of Consciousness: sedated  Airway & Oxygen Therapy: Patient Spontanous Breathing  Post-op Assessment: Report given to RN and Post -op Vital signs reviewed and stable  Post vital signs: Reviewed and stable  Last Vitals:  Vitals Value Taken Time  BP    Temp    Pulse    Resp    SpO2      Last Pain:  Vitals:   06/23/19 0920  TempSrc: Tympanic  PainSc: 0-No pain         Complications: No apparent anesthesia complications

## 2019-06-23 NOTE — Anesthesia Preprocedure Evaluation (Signed)
Anesthesia Evaluation  Patient identified by MRN, date of birth, ID band Patient awake    Reviewed: Allergy & Precautions, H&P , NPO status , Patient's Chart, lab work & pertinent test results, reviewed documented beta blocker date and time   Airway Mallampati: II   Neck ROM: full    Dental  (+) Poor Dentition   Pulmonary neg pulmonary ROS,    Pulmonary exam normal        Cardiovascular Exercise Tolerance: Good negative cardio ROS Normal cardiovascular exam Rhythm:regular Rate:Normal     Neuro/Psych negative neurological ROS  negative psych ROS   GI/Hepatic negative GI ROS, Neg liver ROS,   Endo/Other  negative endocrine ROS  Renal/GU negative Renal ROS  negative genitourinary   Musculoskeletal   Abdominal   Peds  Hematology negative hematology ROS (+)   Anesthesia Other Findings Past Medical History: No date: Herpes, genital No date: History of chicken pox No date: Hypercholesteremia Past Surgical History: No date: VASECTOMY BMI    Body Mass Index: 31.17 kg/m     Reproductive/Obstetrics negative OB ROS                             Anesthesia Physical Anesthesia Plan  ASA: II  Anesthesia Plan: General   Post-op Pain Management:    Induction:   PONV Risk Score and Plan:   Airway Management Planned:   Additional Equipment:   Intra-op Plan:   Post-operative Plan:   Informed Consent: I have reviewed the patients History and Physical, chart, labs and discussed the procedure including the risks, benefits and alternatives for the proposed anesthesia with the patient or authorized representative who has indicated his/her understanding and acceptance.     Dental Advisory Given  Plan Discussed with: CRNA  Anesthesia Plan Comments:         Anesthesia Quick Evaluation

## 2019-06-23 NOTE — H&P (Signed)
Jonathon Bellows, MD [address], [place], [place], [place], [address] [address], [place], [place], [place], [address] Phone: [phone]  Fax: [phone]  Primary Care Physician:  Jearld Fenton, NP   Pre-Procedure History & Physical: HPI:  Joseph Salazar is a 49 y.o. male is here for an colonoscopy.   Past Medical History:  Diagnosis Date  . Herpes, genital   . History of chicken pox   . Hypercholesteremia     Past Surgical History:  Procedure Laterality Date  . VASECTOMY      Prior to Admission medications   Medication Sig Start Date End Date Taking? Authorizing Provider  atorvastatin (LIPITOR) 40 MG tablet Take 1 tablet (40 mg total) by mouth daily. 10/24/18   Jearld Fenton, NP  Cyanocobalamin (VITAMIN B-12) 500 MCG SUBL Place 1 tablet under the tongue daily. 100 mcg    [provider]  Multiple Vitamins-Minerals (MULTIVITAMIN ADULT PO) Take 1 tablet by mouth daily.    [provider]  tamsulosin (FLOMAX) 0.4 MG CAPS capsule Take 1 capsule (0.4 mg total) by mouth daily. 05/12/19   Jearld Fenton, NP  triamcinolone cream (KENALOG) 0.1 % APPLY TO AFFECTED AREA TWICE A DAY 02/12/19   Jearld Fenton, NP    Allergies as of 05/19/2019 - Review Complete 05/19/2019  Allergen Reaction Noted  . Penicillins Anaphylaxis 02/22/2017    Family History  Problem Relation Age of Onset  . Lung cancer Mother   . Diabetes Father   . Hyperlipidemia Father   . Heart disease Father   . Heart disease Maternal Grandmother   . Breast cancer Maternal Grandmother   . Heart disease Paternal Grandmother   . Diabetes Paternal Grandmother   . Heart disease Paternal Grandfather     Social History   Socioeconomic History  . Marital status: Divorced    Spouse name: Not on file  . Number of children: Not on file  . Years of education: Not on file  . Highest education level: Not on file  Occupational History  . Not on file  Tobacco Use  . Smoking  status: Never Smoker  . Smokeless tobacco: Never Used  Substance and Sexual Activity  . Alcohol use: Yes    Comment: occasional  . Drug use: No  . Sexual activity: Not on file  Other Topics Concern  . Not on file  Social History Narrative  . Not on file   Social Determinants of Health   Financial Resource Strain:   . Difficulty of Paying Living Expenses:   Food Insecurity:   . Worried About Charity fundraiser in the Last Year:   . Arboriculturist in the Last Year:   Transportation Needs:   . Film/video editor (Medical):   Marland Kitchen Lack of Transportation (Non-Medical):   Physical Activity:   . Days of Exercise per Week:   . Minutes of Exercise per Session:   Stress:   . Feeling of Stress :   Social Connections:   . Frequency of Communication with Friends and Family:   . Frequency of Social Gatherings with Friends and Family:   . Attends Religious Services:   . Active Member of Clubs or Organizations:   . Attends Archivist Meetings:   Marland Kitchen Marital Status:   Intimate Partner Violence:   . Fear of Current or Ex-Partner:   . Emotionally Abused:   Marland Kitchen Physically Abused:   . Sexually Abused:  Review of Systems: See HPI, otherwise negative ROS  Physical Exam: BP (!) 151/102   Pulse 90   Temp (!) 97.2 F (36.2 C) (Tympanic)   Resp 16   Ht 5\' 8"  (1.727 m)   Wt 93 kg   SpO2 100%   BMI 31.17 kg/m  General:   Alert,  pleasant and cooperative in NAD Head:  Normocephalic and atraumatic. Neck:  Supple; no masses or thyromegaly. Lungs:  Clear throughout to auscultation, normal respiratory effort.    Heart:  +S1, +S2, Regular rate and rhythm, No edema. Abdomen:  Soft, nontender and nondistended. Normal bowel sounds, without guarding, and without rebound.   Neurologic:  Alert and  oriented x4;  grossly normal neurologically.  Impression/Plan: Joseph Salazar is here for an colonoscopy to be performed for Screening colonoscopy average risk   Risks, benefits,  limitations, and alternatives regarding  colonoscopy have been reviewed with the patient.  Questions have been answered.  All parties agreeable.   Feliciana Rossetti, MD  06/23/2019, 9:47 AM

## 2019-06-23 NOTE — Anesthesia Postprocedure Evaluation (Signed)
Anesthesia Post Note  Patient: Alieu Finnigan [facility]  Procedure(s) Performed: COLONOSCOPY WITH PROPOFOL (N/A )  Patient location during evaluation: PACU Anesthesia Type: General Level of consciousness: awake and alert Pain management: pain level controlled Vital Signs Assessment: post-procedure vital signs reviewed and stable Respiratory status: spontaneous breathing, nonlabored ventilation, respiratory function stable and patient connected to nasal cannula oxygen Cardiovascular status: blood pressure returned to baseline and stable Postop Assessment: no apparent nausea or vomiting Anesthetic complications: no     Last Vitals:  Vitals:   06/23/19 0920 06/23/19 1010  BP: (!) 151/102 118/89  Pulse: 90 74  Resp: 16 12  Temp: (!) 36.2 C   SpO2: 100% 95%    Last Pain:  Vitals:   06/23/19 0920  TempSrc: Tympanic  PainSc: 0-No pain                 Yevette Edwards

## 2019-06-23 NOTE — Op Note (Signed)
[facility] Gastroenterology Patient Name: Joseph Salazar Procedure Date: 06/23/2019 9:50 AM MRN: [id number] Account #: [id number] Date of Birth: 1970-10-06 Admit Type: Outpatient Age: 49 Room: [facility] ENDO ROOM 4 Gender: Male Note Status: Finalized Procedure:             Colonoscopy Indications:           Screening for colorectal malignant neoplasm Providers:             Joseph Bellows MD, MD Medicines:             Monitored Anesthesia Care Complications:         No immediate complications. Procedure:             Pre-Anesthesia Assessment:                        - Prior to the procedure, a History and Physical was                         performed, and patient medications, allergies and                         sensitivities were reviewed. The patient's tolerance                         of previous anesthesia was reviewed.                        - The risks and benefits of the procedure and the                         sedation options and risks were discussed with the                         patient. All questions were answered and informed                         consent was obtained.                        - ASA Grade Assessment: II - A patient with mild                         systemic disease.                        After obtaining informed consent, the colonoscope was                         passed under direct vision. Throughout the procedure,                         the patient's blood pressure, pulse, and oxygen                         saturations were monitored continuously. The                         Colonoscope was introduced through the anus with the  intention of advancing to the cecum. The scope was                         advanced to the sigmoid colon before the procedure was                         aborted. Medications were given. The colonoscopy was                         performed with ease. The patient tolerated the             procedure well. The quality of the bowel preparation                         was poor. Findings:      The perianal and digital rectal examinations were normal.      A moderate amount of solid stool was found in the rectum and in the       sigmoid colon, interfering with visualization. Impression:            - Preparation of the colon was poor.                        - Stool in the rectum and in the sigmoid colon.                        - No specimens collected. Recommendation:        - Discharge patient to home (with escort).                        - Resume previous diet.                        - Continue present medications.                        - Repeat colonoscopy in 2 weeks because the bowel                         preparation was suboptimal. Procedure Code(s):     --- Professional ---                        [phone], 53, Colonoscopy, flexible; diagnostic,                         including collection of specimen(s) by brushing or                         washing, when performed (separate procedure) Diagnosis Code(s):     --- Professional ---                        Z12.11, Encounter for screening for malignant neoplasm                         of colon CPT copyright 2019 American Medical Association. All rights reserved. The codes documented in this report are preliminary and upon coder review may  be revised to meet current compliance requirements. Joseph Mood, MD Joseph Mood MD, MD  06/23/2019 10:04:33 AM This report has been signed electronically. Number of Addenda: 0 Note Initiated On: 06/23/2019 9:50 AM Estimated Blood Loss:  Estimated blood loss: none.      [facility]

## 2019-06-24 ENCOUNTER — Encounter: Payer: Self-pay | Admitting: *Deleted

## 2019-07-11 ENCOUNTER — Other Ambulatory Visit: Payer: Self-pay | Admitting: Gastroenterology

## 2019-07-11 ENCOUNTER — Other Ambulatory Visit: Payer: Self-pay

## 2019-07-11 DIAGNOSIS — Z1211 Encounter for screening for malignant neoplasm of colon: Secondary | ICD-10-CM

## 2019-07-11 MED ORDER — PEG 3350-KCL-NA BICARB-NACL 420 G PO SOLR
ORAL | 0 refills | Status: DC
Start: 2019-07-11 — End: 2019-07-15

## 2019-08-06 ENCOUNTER — Telehealth: Payer: Self-pay

## 2019-08-06 NOTE — Telephone Encounter (Signed)
Colonoscopy has been canceled due to uncertainty of insurance covering procedure.  Endoscopy notified.  Referral has been noted.  Thanks,  [place], [place]

## 2019-08-07 ENCOUNTER — Other Ambulatory Visit: Admission: RE | Admit: 2019-08-07 | Payer: Commercial Managed Care - PPO | Source: Ambulatory Visit

## 2019-08-11 ENCOUNTER — Encounter: Admission: RE | Payer: Self-pay | Source: Home / Self Care

## 2019-08-11 ENCOUNTER — Ambulatory Visit
Admission: RE | Admit: 2019-08-11 | Payer: Commercial Managed Care - PPO | Source: Home / Self Care | Admitting: Gastroenterology

## 2019-08-11 SURGERY — COLONOSCOPY WITH PROPOFOL
Anesthesia: General

## 2019-08-15 ENCOUNTER — Other Ambulatory Visit: Payer: Self-pay | Admitting: Internal Medicine

## 2019-09-17 ENCOUNTER — Other Ambulatory Visit: Payer: Self-pay | Admitting: Internal Medicine

## 2019-09-19 MED ORDER — TRIAMCINOLONE ACETONIDE 0.1 % EX CREA: TOPICAL_CREAM | CUTANEOUS | 0 refills | Status: DC

## 2019-10-22 ENCOUNTER — Encounter: Payer: Self-pay | Admitting: Internal Medicine

## 2020-02-23 ENCOUNTER — Other Ambulatory Visit: Payer: Self-pay | Admitting: Internal Medicine

## 2020-05-25 ENCOUNTER — Other Ambulatory Visit: Payer: Self-pay | Admitting: Internal Medicine

## 2020-06-05 ENCOUNTER — Other Ambulatory Visit: Payer: Self-pay | Admitting: Internal Medicine

## 2020-06-16 NOTE — Telephone Encounter (Signed)
Refill request Flomax Last office visit 05/12/19 Last refill 05/12/19 #90/3 No upcoming appointment with a PCP

## 2020-07-15 ENCOUNTER — Encounter: Payer: Self-pay | Admitting: Internal Medicine

## 2020-07-15 ENCOUNTER — Ambulatory Visit (INDEPENDENT_AMBULATORY_CARE_PROVIDER_SITE_OTHER): Payer: Commercial Managed Care - PPO | Admitting: Internal Medicine

## 2020-07-15 ENCOUNTER — Ambulatory Visit
Admission: RE | Admit: 2020-07-15 | Discharge: 2020-07-15 | Disposition: A | Discharge status: Home / Self Care | Payer: Commercial Managed Care - PPO | Attending: Internal Medicine | Admitting: Internal Medicine

## 2020-07-15 ENCOUNTER — Other Ambulatory Visit: Payer: Self-pay

## 2020-07-15 ENCOUNTER — Ambulatory Visit
Admission: RE | Admit: 2020-07-15 | Discharge: 2020-07-15 | Disposition: A | Discharge status: Home / Self Care | Payer: Commercial Managed Care - PPO | Source: Ambulatory Visit | Attending: Internal Medicine | Admitting: Internal Medicine

## 2020-07-15 VITALS — BP 137/73 | HR 99 | Ht 68.0 in | Wt 212.0 lb

## 2020-07-15 DIAGNOSIS — Z1159 Encounter for screening for other viral diseases: Secondary | ICD-10-CM | POA: Diagnosis not present

## 2020-07-15 DIAGNOSIS — Z6832 Body mass index (BMI) 32.0-32.9, adult: Secondary | ICD-10-CM

## 2020-07-15 DIAGNOSIS — M79642 Pain in left hand: Secondary | ICD-10-CM | POA: Insufficient documentation

## 2020-07-15 DIAGNOSIS — M79641 Pain in right hand: Secondary | ICD-10-CM

## 2020-07-15 DIAGNOSIS — L2084 Intrinsic (allergic) eczema: Secondary | ICD-10-CM | POA: Diagnosis not present

## 2020-07-15 DIAGNOSIS — H9193 Unspecified hearing loss, bilateral: Secondary | ICD-10-CM

## 2020-07-15 DIAGNOSIS — A6001 Herpesviral infection of penis: Secondary | ICD-10-CM | POA: Diagnosis not present

## 2020-07-15 DIAGNOSIS — Z0001 Encounter for general adult medical examination with abnormal findings: Secondary | ICD-10-CM | POA: Diagnosis not present

## 2020-07-15 DIAGNOSIS — E7800 Pure hypercholesterolemia, unspecified: Secondary | ICD-10-CM | POA: Diagnosis not present

## 2020-07-15 DIAGNOSIS — N401 Enlarged prostate with lower urinary tract symptoms: Secondary | ICD-10-CM

## 2020-07-15 DIAGNOSIS — Z23 Encounter for immunization: Secondary | ICD-10-CM

## 2020-07-15 DIAGNOSIS — R350 Frequency of micturition: Secondary | ICD-10-CM

## 2020-07-15 DIAGNOSIS — E6609 Other obesity due to excess calories: Secondary | ICD-10-CM

## 2020-07-15 MED ORDER — TRIAMCINOLONE ACETONIDE 0.1 % EX CREA: TOPICAL_CREAM | CUTANEOUS | 1 refills | Status: DC

## 2020-07-15 MED ORDER — ATORVASTATIN CALCIUM 40 MG PO TABS: 40.0000 mg | ORAL_TABLET | Freq: Every day | ORAL | 3 refills | Status: DC

## 2020-07-15 NOTE — Patient Instructions (Signed)
Health Maintenance, Male Adopting a healthy lifestyle and getting preventive care are important in promoting health and wellness. Ask your health care provider about: The right schedule for you to have regular tests and exams. Things you can do on your own to prevent diseases and keep yourself healthy. What should I know about diet, weight, and exercise? Eat a healthy diet  Eat a diet that includes plenty of vegetables, fruits, low-fat dairy products, and lean protein. Do not eat a lot of foods that are high in solid fats, added sugars, or sodium.  Maintain a healthy weight Body mass index (BMI) is a measurement that can be used to identify possible weight problems. It estimates body fat based on height and weight. Your health care provider can help determine your BMI and help you achieve or maintain ahealthy weight. Get regular exercise Get regular exercise. This is one of the most important things you can do for your health. Most adults should: Exercise for at least 150 minutes each week. The exercise should increase your heart rate and make you sweat (moderate-intensity exercise). Do strengthening exercises at least twice a week. This is in addition to the moderate-intensity exercise. Spend less time sitting. Even light physical activity can be beneficial. Watch cholesterol and blood lipids Have your blood tested for lipids and cholesterol at 51 years of age, then havethis test every 5 years. You may need to have your cholesterol levels checked more often if: Your lipid or cholesterol levels are high. You are older than 51 years of age. You are at high risk for heart disease. What should I know about cancer screening? Many types of cancers can be detected early and may often be prevented. Depending on your health history and family history, you may need to have cancer screening at various ages. This may include screening for: Colorectal cancer. Prostate cancer. Skin cancer. Lung  cancer. What should I know about heart disease, diabetes, and high blood pressure? Blood pressure and heart disease High blood pressure causes heart disease and increases the risk of stroke. This is more likely to develop in people who have high blood pressure readings, are of African descent, or are overweight. Talk with your health care provider about your target blood pressure readings. Have your blood pressure checked: Every 3-5 years if you are 18-39 years of age. Every year if you are 40 years old or older. If you are between the ages of 65 and 75 and are a current or former smoker, ask your health care provider if you should have a one-time screening for abdominal aortic aneurysm (AAA). Diabetes Have regular diabetes screenings. This checks your fasting blood sugar level. Have the screening done: Once every three years after age 45 if you are at a normal weight and have a low risk for diabetes. More often and at a younger age if you are overweight or have a high risk for diabetes. What should I know about preventing infection? Hepatitis B If you have a higher risk for hepatitis B, you should be screened for this virus. Talk with your health care provider to find out if you are at risk forhepatitis B infection. Hepatitis C Blood testing is recommended for: Everyone born from 1945 through 1965. Anyone with known risk factors for hepatitis C. Sexually transmitted infections (STIs) You should be screened each year for STIs, including gonorrhea and chlamydia, if: You are sexually active and are younger than 51 years of age. You are older than 51 years of age   and your health care provider tells you that you are at risk for this type of infection. Your sexual activity has changed since you were last screened, and you are at increased risk for chlamydia or gonorrhea. Ask your health care provider if you are at risk. Ask your health care provider about whether you are at high risk for HIV.  Your health care provider may recommend a prescription medicine to help prevent HIV infection. If you choose to take medicine to prevent HIV, you should first get tested for HIV. You should then be tested every 3 months for as long as you are taking the medicine. Follow these instructions at home: Lifestyle Do not use any products that contain nicotine or tobacco, such as cigarettes, e-cigarettes, and chewing tobacco. If you need help quitting, ask your health care provider. Do not use street drugs. Do not share needles. Ask your health care provider for help if you need support or information about quitting drugs. Alcohol use Do not drink alcohol if your health care provider tells you not to drink. If you drink alcohol: Limit how much you have to 0-2 drinks a day. Be aware of how much alcohol is in your drink. In the [place] one drink equals one 12 oz bottle of beer (355 mL), one 5 oz glass of wine (148 mL), or one 1 oz glass of hard liquor (44 mL). General instructions Schedule regular health, dental, and eye exams. Stay current with your vaccines. Tell your health care provider if: You often feel depressed. You have ever been abused or do not feel safe at home. Summary Adopting a healthy lifestyle and getting preventive care are important in promoting health and wellness. Follow your health care provider's instructions about healthy diet, exercising, and getting tested or screened for diseases. Follow your health care provider's instructions on monitoring your cholesterol and blood pressure. This information is not intended to replace advice given to you by your health care provider. Make sure you discuss any questions you have with your healthcare provider. Document Revised: 12/26/2017 Document Reviewed: 12/26/2017 Elsevier Patient Education  2022 [employer]  

## 2020-07-15 NOTE — Progress Notes (Signed)
Subjective:    Patient ID: Joseph Salazar, male    DOB: 10-27-1970, 50 y.o.   MRN: [id number]  HPI  Patient presents the clinic today for his annual exam.  He is also due to follow-up chronic conditions.  HSV-2: He denies recent outbreak.  He is not currently on any suppressive antiviral therapy at this time.  HLD: His last LDL was 105, triglycerides 183, 04/2019.  He denies myalgias on Atorvastatin but reports he has been out for 2 weeks.  He tries to consume a low-fat diet.  He also reports bilateral hand pain.  He describes the pain as sore and achy.  He reports associated clicking and popping when trying to opening his hands from a fist position.  He has not noticed any swelling.  BPH: Mainly urinary frequency.  He is taking Flomax as prescribed.  Eczema: Managed with Triamcinolone.  He would like a refill of this today.  Flu: 09/2019 Tetanus: 03/2016 COVID:  Moderna PSA screening: 04/2019 Colon screening: 06/2019 Vision screening: as needed Dentist: as needed  Diet: He does eat meat. He consumes fruits and veggies. He tries to avoid fried foods. He drinks mostly water and coffee. Exercise: yardwork   Review of Systems     Past Medical History:  Diagnosis Date   Herpes, genital    History of chicken pox    Hypercholesteremia     Current Outpatient Medications  Medication Sig Dispense Refill   atorvastatin (LIPITOR) 40 MG tablet TAKE 1 TABLET (40 MG TOTAL) BY MOUTH DAILY. MUST SCHEDULE PHYSICAL 90 tablet 0   Cyanocobalamin (VITAMIN B-12) 500 MCG SUBL Place 1 tablet under the tongue daily. 100 mcg     Multiple Vitamins-Minerals (MULTIVITAMIN ADULT PO) Take 1 tablet by mouth daily.     polyethylene glycol powder (MIRALAX) 17 GM/SCOOP powder Please specify directions, refills and quantity 255 g 0   tamsulosin (FLOMAX) 0.4 MG CAPS capsule TAKE 1 CAPSULE BY MOUTH EVERY DAY 90 capsule 3   triamcinolone cream (KENALOG) 0.1 % APPLY TO AFFECTED AREA TWICE A DAY 30 g 0    No current facility-administered medications for this visit.    Allergies  Allergen Reactions   Penicillins Anaphylaxis    Family History  Problem Relation Age of Onset   Lung cancer Mother    Diabetes Father    Hyperlipidemia Father    Heart disease Father    Heart disease Maternal Grandmother    Breast cancer Maternal Grandmother    Heart disease Paternal Grandmother    Diabetes Paternal Grandmother    Heart disease Paternal Grandfather     Social History   Socioeconomic History   Marital status: Divorced    Spouse name: Not on file   Number of children: Not on file   Years of education: Not on file   Highest education level: Not on file  Occupational History   Not on file  Tobacco Use   Smoking status: Never   Smokeless tobacco: Never  Substance and Sexual Activity   Alcohol use: Yes    Comment: occasional   Drug use: No   Sexual activity: Not on file  Other Topics Concern   Not on file  Social History Narrative   Not on file   Social Determinants of Health   Financial Resource Strain: Not on file  Food Insecurity: Not on file  Transportation Needs: Not on file  Physical Activity: Not on file  Stress: Not on file  Social Connections: Not on  file  Intimate Partner Violence: Not on file     Constitutional: Denies fever, malaise, fatigue, headache or abrupt weight changes.  HEENT: Patient reports decreased hearing bilaterally.  Denies eye pain, eye redness, ear pain, ringing in the ears, wax buildup, runny nose, nasal congestion, bloody nose, or sore throat. Respiratory: Denies difficulty breathing, shortness of breath, cough or sputum production.   Cardiovascular: Denies chest pain, chest tightness, palpitations or swelling in the hands or feet.  Gastrointestinal: Denies abdominal pain, bloating, constipation, diarrhea or blood in the stool.  GU: Patient reports urinary frequency, decreased libido.  Denies urgency, pain with urination, burning  sensation, blood in urine, odor or discharge. Musculoskeletal: Patient reports bilateral hand pain.  Denies decrease in range of motion, difficulty with gait, muscle pain or joint swelling.  Skin: Patient reports dry skin of face.  Denies redness, lesions or ulcercations.  Neurological: Denies dizziness, difficulty with memory, difficulty with speech or problems with balance and coordination.  Psych: Denies anxiety, depression, SI/HI.  No other specific complaints in a complete review of systems (except as listed in HPI above).  Objective:   Physical Exam  BP 137/73 (BP Location: Right Arm, Patient Position: Sitting, Cuff Size: Normal)   Pulse 99   Ht '5\' 8"'  (1.727 m)   Wt 212 lb (96.2 kg)   SpO2 100%   BMI 32.23 kg/m   Wt Readings from Last 3 Encounters:  06/23/19 205 lb (93 kg)  05/12/19 205 lb (93 kg)  03/26/18 217 lb (98.4 kg)    General: Appears his stated age, obese, in NAD. Skin: Warm, dry and intact.  Eczema noted over forehead and ears. HEENT: Head: normal shape and size; Eyes: sclera white and EOMs intact; Ears: Tm's gray and intact, normal light reflex, normal Weber, Rinne test;  Neck:  Neck supple, trachea midline. No masses, lumps or thyromegaly present.  Cardiovascular: Normal rate and rhythm. S1,S2 noted.  No murmur, rubs or gallops noted. No JVD or BLE edema. No carotid bruits noted. Pulmonary/Chest: Normal effort and positive vesicular breath sounds. No respiratory distress. No wheezes, rales or ronchi noted.  Abdomen: Soft and nontender. Normal bowel sounds. No distention or masses noted. Liver, spleen and kidneys non palpable. Musculoskeletal: Normal flexion of the fingers.  Crepitus noted of the pinky fingers bilaterally with extension of the fingers.  No joint swelling noted.  Strength 5/5 BUE/BLE.  No difficulty with gait.  Neurological: Alert and oriented. Cranial nerves II-XII grossly intact. Coordination normal.  Psychiatric: Mood and affect normal. Behavior  is normal. Judgment and thought content normal.    BMET    Component Value Date/Time   NA 140 05/12/2019 1614   K 4.2 05/12/2019 1614   CL 103 05/12/2019 1614   CO2 30 05/12/2019 1614   GLUCOSE 80 05/12/2019 1614   BUN 13 05/12/2019 1614   CREATININE 1.15 05/12/2019 1614   CALCIUM 9.3 05/12/2019 1614    Lipid Panel     Component Value Date/Time   CHOL 181 05/12/2019 1614   TRIG 183.0 (H) 05/12/2019 1614   HDL 39.50 05/12/2019 1614   CHOLHDL 5 05/12/2019 1614   VLDL 36.6 05/12/2019 1614   LDLCALC 105 (H) 05/12/2019 1614    CBC    Component Value Date/Time   WBC 8.1 05/12/2019 1614   RBC 5.18 05/12/2019 1614   HGB 16.1 05/12/2019 1614   HCT 47.4 05/12/2019 1614   PLT 243.0 05/12/2019 1614   MCV 91.5 05/12/2019 1614   MCHC 34.1 05/12/2019  1614   RDW 13.3 05/12/2019 1614    Hgb A1C Lab Results  Component Value Date   HGBA1C 5.5 03/26/2018           Assessment & Plan:   Preventative Health Maintenance:  Encouraged him to get a flu shot in the fall Tetanus UTD Encouraged him to get his Covid booster Zostavax today Colon screening UTD Encouraged him to consume a balanced diet and exercise regimen Advised him to see an eye doctor and dentist annually We will check CBC, c-Met, TSH, lipid, A1c and Hep C today  Decreased Hearing Bilaterally:  Normal hearing exam We will monitor  Bilateral Hand Pain:  X-ray right and left hand today Consider referral to hand specialist  RTC in 1 year, sooner if needed Webb Silversmith, NP This visit occurred during the SARS-CoV-2 public health emergency.  Safety protocols were in place, including screening questions prior to the visit, additional usage of staff PPE, and extensive cleaning of exam room while observing appropriate contact time as indicated for disinfecting solutions.

## 2020-07-16 DIAGNOSIS — E6609 Other obesity due to excess calories: Secondary | ICD-10-CM | POA: Insufficient documentation

## 2020-07-16 DIAGNOSIS — N401 Enlarged prostate with lower urinary tract symptoms: Secondary | ICD-10-CM | POA: Insufficient documentation

## 2020-07-16 DIAGNOSIS — R350 Frequency of micturition: Secondary | ICD-10-CM | POA: Insufficient documentation

## 2020-07-16 DIAGNOSIS — Z6832 Body mass index (BMI) 32.0-32.9, adult: Secondary | ICD-10-CM | POA: Insufficient documentation

## 2020-07-16 DIAGNOSIS — L2084 Intrinsic (allergic) eczema: Secondary | ICD-10-CM | POA: Insufficient documentation

## 2020-07-16 NOTE — Assessment & Plan Note (Signed)
C-Met, lipid and A1c today Encouraged him to consume a low-fat diet Atorvastatin refilled today

## 2020-07-16 NOTE — Assessment & Plan Note (Signed)
Triamcinolone refilled today

## 2020-07-16 NOTE — Assessment & Plan Note (Signed)
Asymptomatic Will monitor 

## 2020-07-16 NOTE — Assessment & Plan Note (Signed)
-   Continue Flomax 

## 2020-07-16 NOTE — Assessment & Plan Note (Signed)
Encourage diet and exercise for weight loss 

## 2020-07-20 ENCOUNTER — Other Ambulatory Visit: Payer: Commercial Managed Care - PPO

## 2020-07-20 ENCOUNTER — Other Ambulatory Visit: Payer: Self-pay

## 2020-07-20 ENCOUNTER — Encounter: Payer: Self-pay | Admitting: Internal Medicine

## 2020-07-20 DIAGNOSIS — M79641 Pain in right hand: Secondary | ICD-10-CM

## 2020-07-20 DIAGNOSIS — Z0001 Encounter for general adult medical examination with abnormal findings: Secondary | ICD-10-CM

## 2020-07-20 DIAGNOSIS — E7800 Pure hypercholesterolemia, unspecified: Secondary | ICD-10-CM

## 2020-07-21 LAB — HEMOGLOBIN A1C
Hgb A1c MFr Bld: 5.2 % of total Hgb (ref <5.7)
Mean Plasma Glucose: 103 mg/dL
eAG (mmol/L): 5.7 mmol/L

## 2020-07-21 LAB — COMPLETE METABOLIC PANEL WITH GFR
AG Ratio: 1.6 (calc) (ref 1.0–2.5)
ALT: 26 U/L (ref 9–46)
AST: 25 U/L (ref 10–35)
Albumin: 4.5 g/dL (ref 3.6–5.1)
Alkaline phosphatase (APISO): 77 U/L (ref 35–144)
BUN: 16 mg/dL (ref 7–25)
CO2: 28 mmol/L (ref 20–32)
Calcium: 10 mg/dL (ref 8.6–10.3)
Chloride: 102 mmol/L (ref 98–110)
Creat: 1.05 mg/dL (ref 0.70–1.33)
GFR, Est African American: 95 mL/min/{1.73_m2} (ref >=60)
GFR, Est Non African American: 82 mL/min/{1.73_m2} (ref >=60)
Globulin: 2.9 g/dL (calc) (ref 1.9–3.7)
Glucose, Bld: 91 mg/dL (ref 65–99)
Potassium: 4.3 mmol/L (ref 3.5–5.3)
Sodium: 139 mmol/L (ref 135–146)
Total Bilirubin: 0.9 mg/dL (ref 0.2–1.2)
Total Protein: 7.4 g/dL (ref 6.1–8.1)

## 2020-07-21 LAB — LIPID PANEL
Cholesterol: 304 mg/dL — ABNORMAL HIGH (ref <200)
HDL: 46 mg/dL (ref >=40)
LDL Cholesterol (Calc): 223 mg/dL (calc) — ABNORMAL HIGH
Non-HDL Cholesterol (Calc): 258 mg/dL (calc) — ABNORMAL HIGH (ref <130)
Total CHOL/HDL Ratio: 6.6 (calc) — ABNORMAL HIGH (ref <5.0)
Triglycerides: 180 mg/dL — ABNORMAL HIGH (ref <150)

## 2020-07-21 LAB — CBC
HCT: 49.6 % (ref 38.5–50.0)
Hemoglobin: 16.4 g/dL (ref 13.2–17.1)
MCH: 30.9 pg (ref 27.0–33.0)
MCHC: 33.1 g/dL (ref 32.0–36.0)
MCV: 93.4 fL (ref 80.0–100.0)
MPV: 10.2 fL (ref 7.5–12.5)
Platelets: 241 10*3/uL (ref 140–400)
RBC: 5.31 10*6/uL (ref 4.20–5.80)
RDW: 12.6 % (ref 11.0–15.0)
WBC: 6.2 10*3/uL (ref 3.8–10.8)

## 2020-07-21 LAB — TSH: TSH: 2.32 mIU/L (ref 0.40–4.50)

## 2020-08-18 ENCOUNTER — Ambulatory Visit (INDEPENDENT_AMBULATORY_CARE_PROVIDER_SITE_OTHER): Payer: Commercial Managed Care - PPO

## 2020-08-18 DIAGNOSIS — Z23 Encounter for immunization: Secondary | ICD-10-CM

## 2021-05-16 ENCOUNTER — Encounter: Payer: Self-pay | Admitting: Internal Medicine

## 2021-05-16 ENCOUNTER — Ambulatory Visit: Payer: Commercial Managed Care - PPO | Admitting: Internal Medicine

## 2021-05-16 VITALS — BP 124/86 | HR 83 | Temp 97.1°F | Wt 214.0 lb

## 2021-05-16 DIAGNOSIS — R5383 Other fatigue: Secondary | ICD-10-CM | POA: Diagnosis not present

## 2021-05-16 DIAGNOSIS — L2084 Intrinsic (allergic) eczema: Secondary | ICD-10-CM

## 2021-05-16 DIAGNOSIS — A6001 Herpesviral infection of penis: Secondary | ICD-10-CM

## 2021-05-16 DIAGNOSIS — R6882 Decreased libido: Secondary | ICD-10-CM | POA: Diagnosis not present

## 2021-05-16 DIAGNOSIS — E6609 Other obesity due to excess calories: Secondary | ICD-10-CM

## 2021-05-16 DIAGNOSIS — E7800 Pure hypercholesterolemia, unspecified: Secondary | ICD-10-CM | POA: Diagnosis not present

## 2021-05-16 DIAGNOSIS — N401 Enlarged prostate with lower urinary tract symptoms: Secondary | ICD-10-CM

## 2021-05-16 DIAGNOSIS — Z6832 Body mass index (BMI) 32.0-32.9, adult: Secondary | ICD-10-CM

## 2021-05-16 DIAGNOSIS — R350 Frequency of micturition: Secondary | ICD-10-CM

## 2021-05-16 NOTE — Assessment & Plan Note (Signed)
He denies recent outbreak ?Not on suppressive antiviral therapy ?

## 2021-05-16 NOTE — Assessment & Plan Note (Signed)
Encourage diet and exercise for weight loss 

## 2021-05-16 NOTE — Assessment & Plan Note (Signed)
Lipid profile today ?Encouraged him to consume low-fat diet ?Continue Atorvastatin ?

## 2021-05-16 NOTE — Patient Instructions (Signed)
Fatigue ?If you have fatigue, you feel tired all the time and have a lack of energy or a lack of motivation. Fatigue may make it difficult to start or complete tasks because of exhaustion. ?Occasional or mild fatigue is often a normal response to activity or life. However, long-term (chronic) or extreme fatigue may be a symptom of a medical condition such as: ?Depression. ?Not having enough red blood cells or hemoglobin in the blood (anemia). ?A problem with a small gland located in the lower front part of the neck (thyroid disorder). ?Rheumatologic conditions. These are problems related to the body's defense system (immune system). ?Infections, especially certain viral infections. ?Fatigue can also lead to negative health outcomes over time. ?Follow these instructions at home: ?Medicines ?Take over-the-counter and prescription medicines only as told by your health care provider. ?Take a multivitamin if told by your health care provider. ?Do not use herbal or dietary supplements unless they are approved by your health care provider. ?Eating and drinking ? ?Avoid heavy meals in the evening. ?Eat a well-balanced diet, which includes lean proteins, whole grains, plenty of fruits and vegetables, and low-fat dairy products. ?Avoid eating or drinking too many products with caffeine in them. ?Avoid alcohol. ?Drink enough fluid to keep your urine pale yellow. ?Activity ? ?Exercise regularly, as told by your health care provider. ?Use or practice techniques to help you relax, such as yoga, tai chi, meditation, or massage therapy. ?Lifestyle ?Change situations that cause you stress. Try to keep your work and personal schedules in balance. ?Do not use recreational or illegal drugs. ?General instructions ?Monitor your fatigue for any changes. ?Go to bed and get up at the same time every day. ?Avoid fatigue by pacing yourself during the day and getting enough sleep at night. ?Maintain a healthy weight. ?Contact a health care  provider if: ?Your fatigue does not get better. ?You have a fever. ?You suddenly lose or gain weight. ?You have headaches. ?You have trouble falling asleep or sleeping through the night. ?You feel angry, guilty, anxious, or sad. ?You have swelling in your legs or another part of your body. ?Get help right away if: ?You feel confused, feel like you might faint, or faint. ?Your vision is blurry or you have a severe headache. ?You have severe pain in your abdomen, your back, or the area between your waist and hips (pelvis). ?You have chest pain, shortness of breath, or an irregular or fast heartbeat. ?You are unable to urinate, or you urinate less than normal. ?You have abnormal bleeding from the rectum, nose, lungs, nipples, or, if you are male, the vagina. ?You vomit blood. ?You have thoughts about hurting yourself or others. ?These symptoms may be an emergency. Get help right away. Call 911. ?Do not wait to see if the symptoms will go away. ?Do not drive yourself to the hospital. ?Get help right away if you feel like you may hurt yourself or others, or have thoughts about taking your own life. Go to your nearest emergency room or: ?Call 911. ?Call the National Suicide Prevention Lifeline at 1-800-273-8255 or 988. This is open 24 hours a day. ?Text the Crisis Text Line at 741741. ?Summary ?If you have fatigue, you feel tired all the time and have a lack of energy or a lack of motivation. ?Fatigue may make it difficult to start or complete tasks because of exhaustion. ?Long-term (chronic) or extreme fatigue may be a symptom of a medical condition. ?Exercise regularly, as told by your health care provider. ?  Change situations that cause you stress. Try to keep your work and personal schedules in balance. ?This information is not intended to replace advice given to you by your health care provider. Make sure you discuss any questions you have with your health care provider. ?Document Revised: 10/25/2020 Document  Reviewed: 10/25/2020 ?Elsevier Patient Education ? 2023 [employer] ? ?

## 2021-05-16 NOTE — Progress Notes (Signed)
? ?Subjective:  ? ? Patient ID: Joseph Salazar, male    DOB: 18-Dec-1970, 51 y.o.   MRN: [id number] ? ?HPI ? ?Patient presents to clinic today for follow-up of chronic conditions. ? ?HSV-2: He denies recent outbreak.  He is not taking any antiviral medication at this time. ? ?HLD: His last LDL was 223, triglycerides 180, 07/2020.  He denies myalgias on Atorvastatin.  He tries to consume low fat diet. ? ?BPH: Mainly urinary frequency.  He is taking Flomax as prescribed.  He does not follow with urology. ? ?Eczema: Managed with Triamcinolone cream.  He does not follow with dermatology. ? ?He also reports fatigue. He noticed this over the past few months. He average about 7 hours of sleep per night. He can fall asleep but is unable to stay asleep. He is feeling stressed but does not feel like anxious or depressed. He does not work out. ? ?Review of Systems ? ?   ?Past Medical History:  ?Diagnosis Date  ? Herpes, genital   ? History of chicken pox   ? Hypercholesteremia   ? ? ?Current Outpatient Medications  ?Medication Sig Dispense Refill  ? atorvastatin (LIPITOR) 40 MG tablet Take 1 tablet (40 mg total) by mouth daily. 90 tablet 3  ? Cyanocobalamin (VITAMIN B-12) 500 MCG SUBL Place 1 tablet under the tongue daily. 100 mcg    ? Multiple Vitamins-Minerals (MULTIVITAMIN ADULT PO) Take 1 tablet by mouth daily.    ? polyethylene glycol powder (MIRALAX) 17 GM/SCOOP powder Please specify directions, refills and quantity 255 g 0  ? tamsulosin (FLOMAX) 0.4 MG CAPS capsule TAKE 1 CAPSULE BY MOUTH EVERY DAY 90 capsule 3  ? triamcinolone cream (KENALOG) 0.1 % APPLY TO AFFECTED AREA TWICE A DAY 80 g 1  ? ?No current facility-administered medications for this visit.  ? ? ?Allergies  ?Allergen Reactions  ? Penicillins Anaphylaxis  ? ? ?Family History  ?Problem Relation Age of Onset  ? Lung cancer Mother   ? Diabetes Father   ? Hyperlipidemia Father   ? Heart disease Father   ? Heart disease Maternal Grandmother   ? Breast cancer  Maternal Grandmother   ? Heart disease Paternal Grandmother   ? Diabetes Paternal Grandmother   ? Heart disease Paternal Grandfather   ? ? ?Social History  ? ?Socioeconomic History  ? Marital status: Divorced  ?  Spouse name: Not on file  ? Number of children: Not on file  ? Years of education: Not on file  ? Highest education level: Not on file  ?Occupational History  ? Not on file  ?Tobacco Use  ? Smoking status: Never  ? Smokeless tobacco: Never  ?Substance and Sexual Activity  ? Alcohol use: Yes  ?  Comment: occasional  ? Drug use: No  ? Sexual activity: Not on file  ?Other Topics Concern  ? Not on file  ?Social History Narrative  ? Not on file  ? ?Social Determinants of Health  ? ?Financial Resource Strain: Not on file  ?Food Insecurity: Not on file  ?Transportation Needs: Not on file  ?Physical Activity: Not on file  ?Stress: Not on file  ?Social Connections: Not on file  ?Intimate Partner Violence: Not on file  ? ? ? ?Constitutional: Denies fever, malaise, fatigue, headache or abrupt weight changes.  ?Respiratory: Denies difficulty breathing, shortness of breath, cough or sputum production.   ?Cardiovascular: Denies chest pain, chest tightness, palpitations or swelling in the hands or feet.  ?Gastrointestinal: Denies abdominal  pain, bloating, constipation, diarrhea or blood in the stool.  ?GU: Pt reports urinary frequency. Denies urgency, pain with urination, burning sensation, blood in urine, odor or discharge. ?Musculoskeletal: Denies decrease in range of motion, difficulty with gait, muscle pain or joint pain and swelling.  ?Skin: Denies redness, rashes, lesions or ulcercations.  ?Neurological: Pt reports difficulty falling asleep. Denies dizziness, difficulty with memory, difficulty with speech or problems with balance and coordination.  ?Psych: Pt reports stress. Denies anxiety, depression, SI/HI. ? ?No other specific complaints in a complete review of systems (except as listed in HPI  above). ? ?Objective:  ? Physical Exam ? ?BP 124/86 (BP Location: Left Arm, Patient Position: Sitting, Cuff Size: Large)   Pulse 83   Temp (!) 97.1 ?F (36.2 ?C) (Temporal)   Wt 214 lb (97.1 kg)   SpO2 97%   BMI 32.54 kg/m?  ? ?Wt Readings from Last 3 Encounters:  ?07/15/20 212 lb (96.2 kg)  ?06/23/19 205 lb (93 kg)  ?05/12/19 205 lb (93 kg)  ? ? ?General: Appears his stated age, obese in NAD. ?Skin: Warm, dry and intact.  ?HEENT: Head: normal shape and size; Eyes: sclera white, no icterus, conjunctiva pink, PERRLA and EOMs intact;  ?Cardiovascular: Normal rate and rhythm. S1,S2 noted.  No murmur, rubs or gallops noted. ?Pulmonary/Chest: Normal effort and positive vesicular breath sounds. No respiratory distress. No wheezes, rales or ronchi noted.  ?Musculoskeletal: No difficulty with gait.  ?Neurological: Alert and oriented.  ?Psychiatric: Mood and affect normal. Behavior is normal. Judgment and thought content normal.  ? ? ? ?BMET ?   ?Component Value Date/Time  ? NA 139 07/20/2020 0812  ? K 4.3 07/20/2020 0812  ? CL 102 07/20/2020 0812  ? CO2 28 07/20/2020 0812  ? GLUCOSE 91 07/20/2020 0812  ? BUN 16 07/20/2020 0812  ? CREATININE 1.05 07/20/2020 0812  ? CALCIUM 10.0 07/20/2020 0812  ? GFRNONAA 82 07/20/2020 0812  ? GFRAA 95 07/20/2020 0812  ? ? ?Lipid Panel  ?   ?Component Value Date/Time  ? CHOL 304 (H) 07/20/2020 9924  ? TRIG 180 (H) 07/20/2020 2683  ? HDL 46 07/20/2020 0812  ? CHOLHDL 6.6 (H) 07/20/2020 4196  ? VLDL 36.6 05/12/2019 1614  ? LDLCALC 223 (H) 07/20/2020 2229  ? ? ?CBC ?   ?Component Value Date/Time  ? WBC 6.2 07/20/2020 0812  ? RBC 5.31 07/20/2020 0812  ? HGB 16.4 07/20/2020 0812  ? HCT 49.6 07/20/2020 0812  ? PLT 241 07/20/2020 0812  ? MCV 93.4 07/20/2020 0812  ? MCH 30.9 07/20/2020 0812  ? MCHC 33.1 07/20/2020 0812  ? RDW 12.6 07/20/2020 0812  ? ? ?Hgb A1C ?Lab Results  ?Component Value Date  ? HGBA1C 5.2 07/20/2020  ? ? ? ? ? ? ? ?   ?Assessment & Plan:  ? ?Fatigue, Decreased Libido: ? ?TSH  reviewed ?We will check vitamin D, vitamin B12 and testosterone ?Encouraged regular physical activity ? ?RTC in 2 months for your annual exam ? ?Nicki Reaper, NP ? ?

## 2021-05-16 NOTE — Assessment & Plan Note (Signed)
Continue Triamcinolone as needed 

## 2021-05-16 NOTE — Assessment & Plan Note (Signed)
-   Continue Flomax 

## 2021-05-17 ENCOUNTER — Encounter: Payer: Self-pay | Admitting: Internal Medicine

## 2021-05-17 DIAGNOSIS — E7800 Pure hypercholesterolemia, unspecified: Secondary | ICD-10-CM

## 2021-05-17 LAB — LIPID PANEL
Cholesterol: 209 mg/dL — ABNORMAL HIGH (ref <200)
HDL: 46 mg/dL (ref >=40)
LDL Cholesterol (Calc): 138 mg/dL (calc) — ABNORMAL HIGH
Non-HDL Cholesterol (Calc): 163 mg/dL (calc) — ABNORMAL HIGH (ref <130)
Total CHOL/HDL Ratio: 4.5 (calc) (ref <5.0)
Triglycerides: 129 mg/dL (ref <150)

## 2021-05-17 LAB — VITAMIN D 25 HYDROXY (VIT D DEFICIENCY, FRACTURES): Vit D, 25-Hydroxy: 30 ng/mL (ref 30–100)

## 2021-05-17 LAB — VITAMIN B12: Vitamin B-12: 666 pg/mL (ref 200–1100)

## 2021-05-17 LAB — TESTOSTERONE: Testosterone: 881 ng/dL — ABNORMAL HIGH (ref 250–827)

## 2021-05-17 MED ORDER — ROSUVASTATIN CALCIUM 10 MG PO TABS: 10.0000 mg | ORAL_TABLET | Freq: Every day | ORAL | 1 refills | Status: DC

## 2021-06-18 ENCOUNTER — Other Ambulatory Visit: Payer: Self-pay | Admitting: Family

## 2021-07-07 IMAGING — DX DG HAND COMPLETE 3+V*R*
3 series · 3 of 3 positions shown · non-contrast
Comparison: None.

CLINICAL DATA: Pain opening and closing hands.

EXAM:
RIGHT HAND - COMPLETE 3+ VIEW

[hand ap]
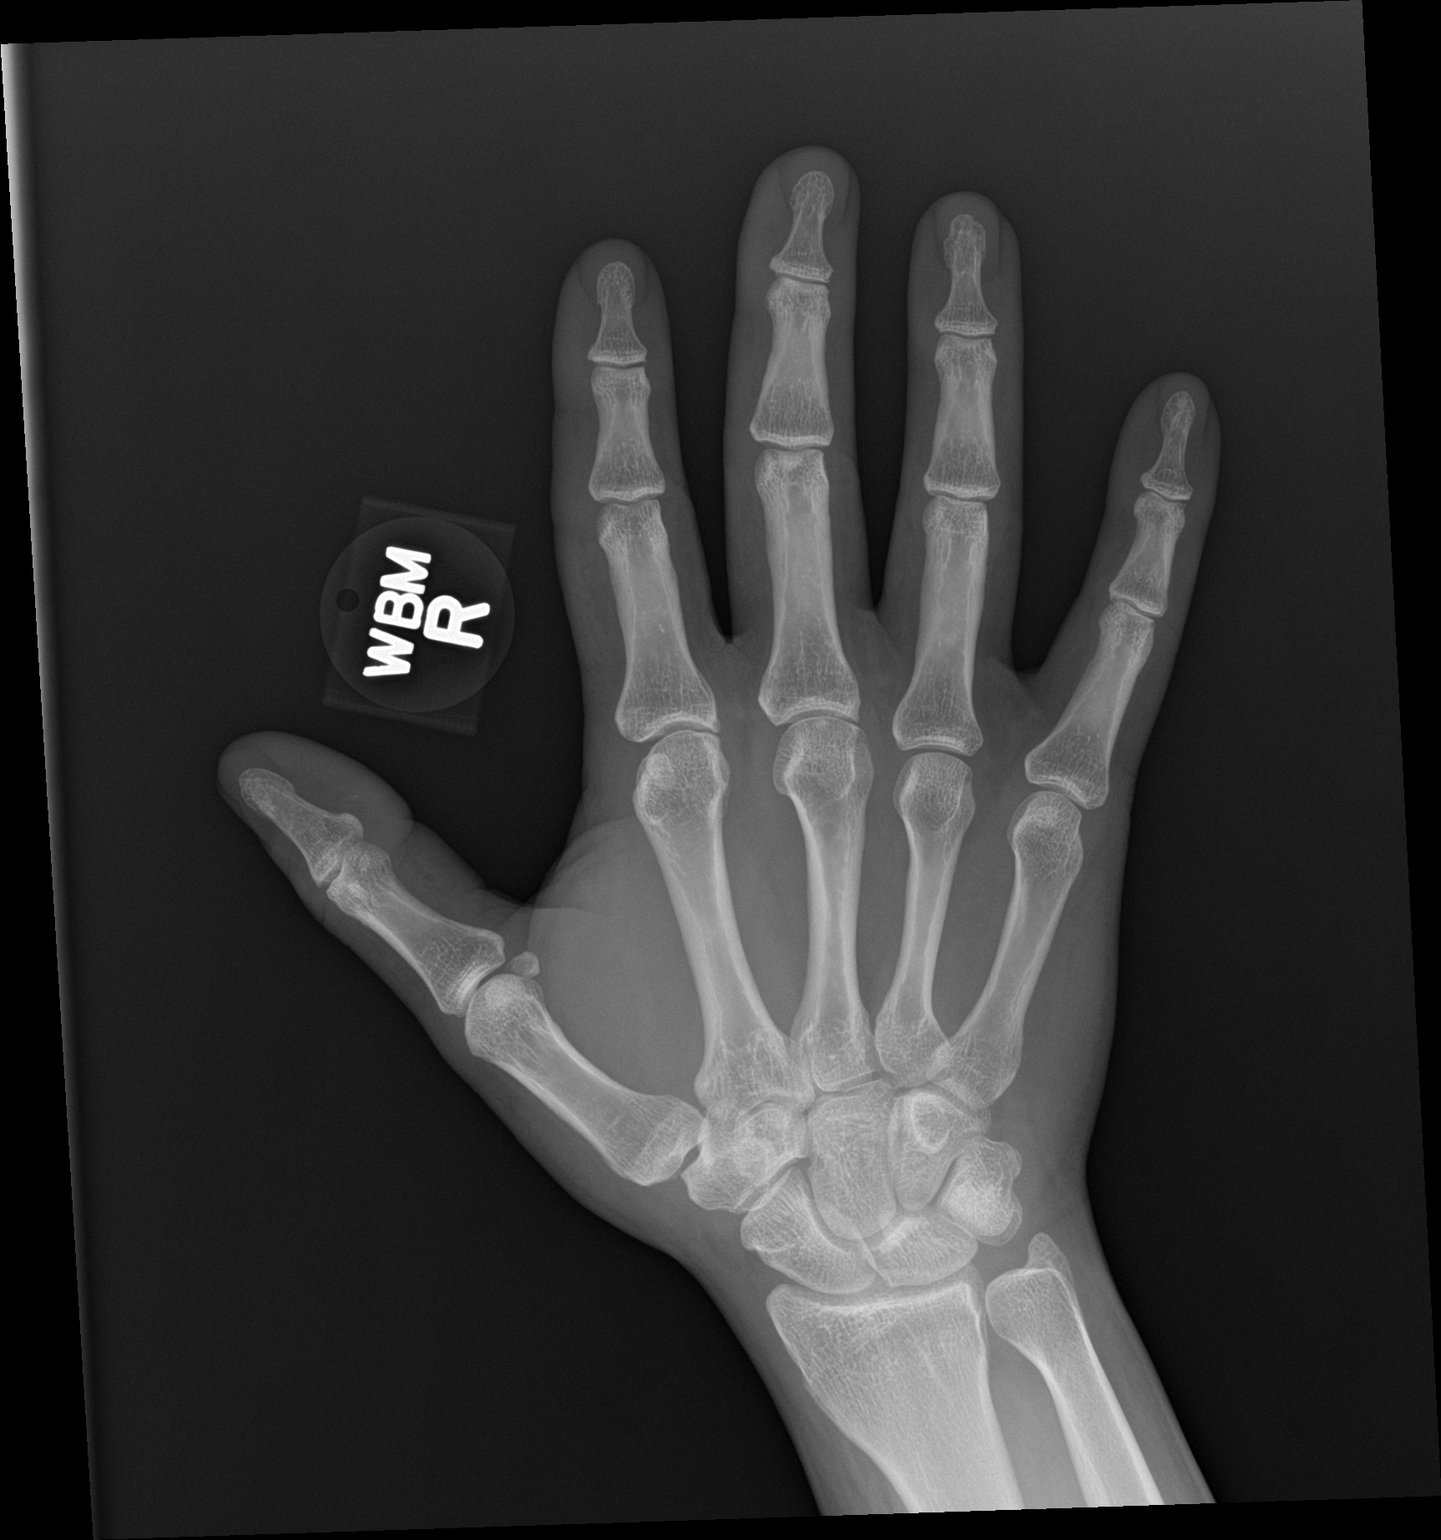

[hand obl]
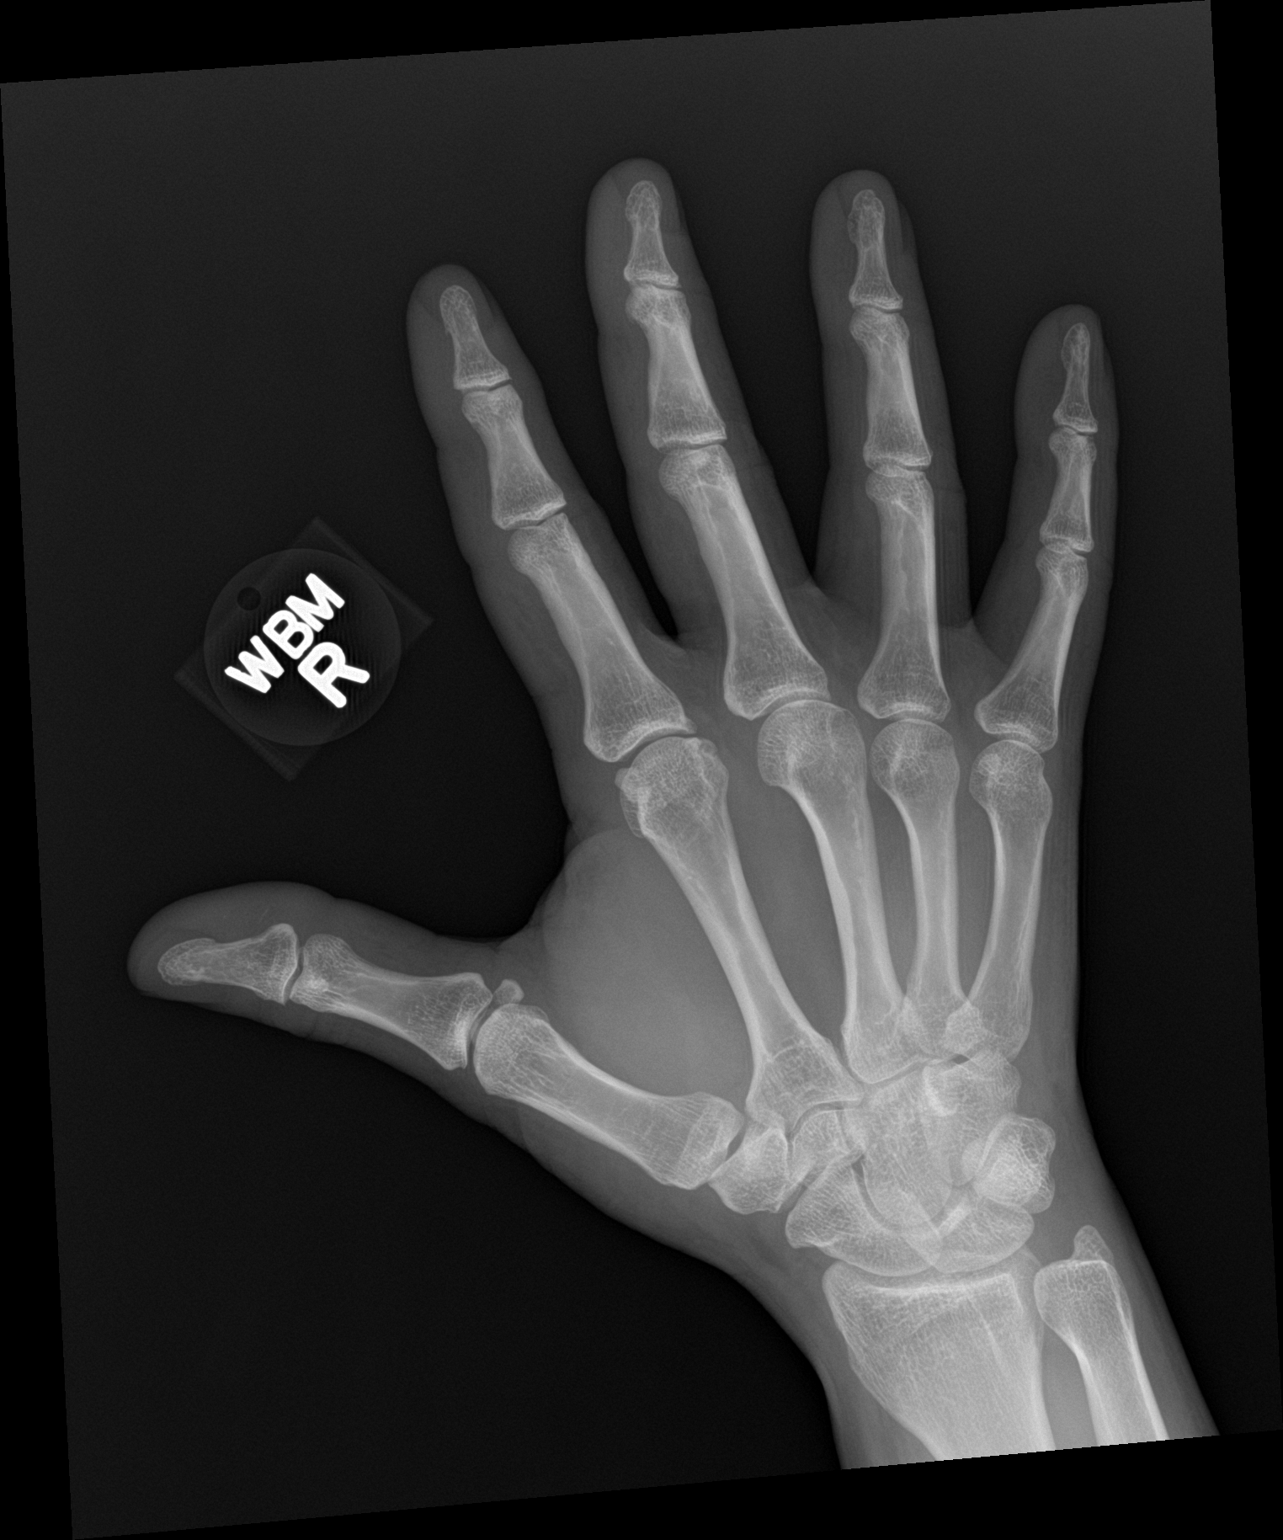

[hand lat]
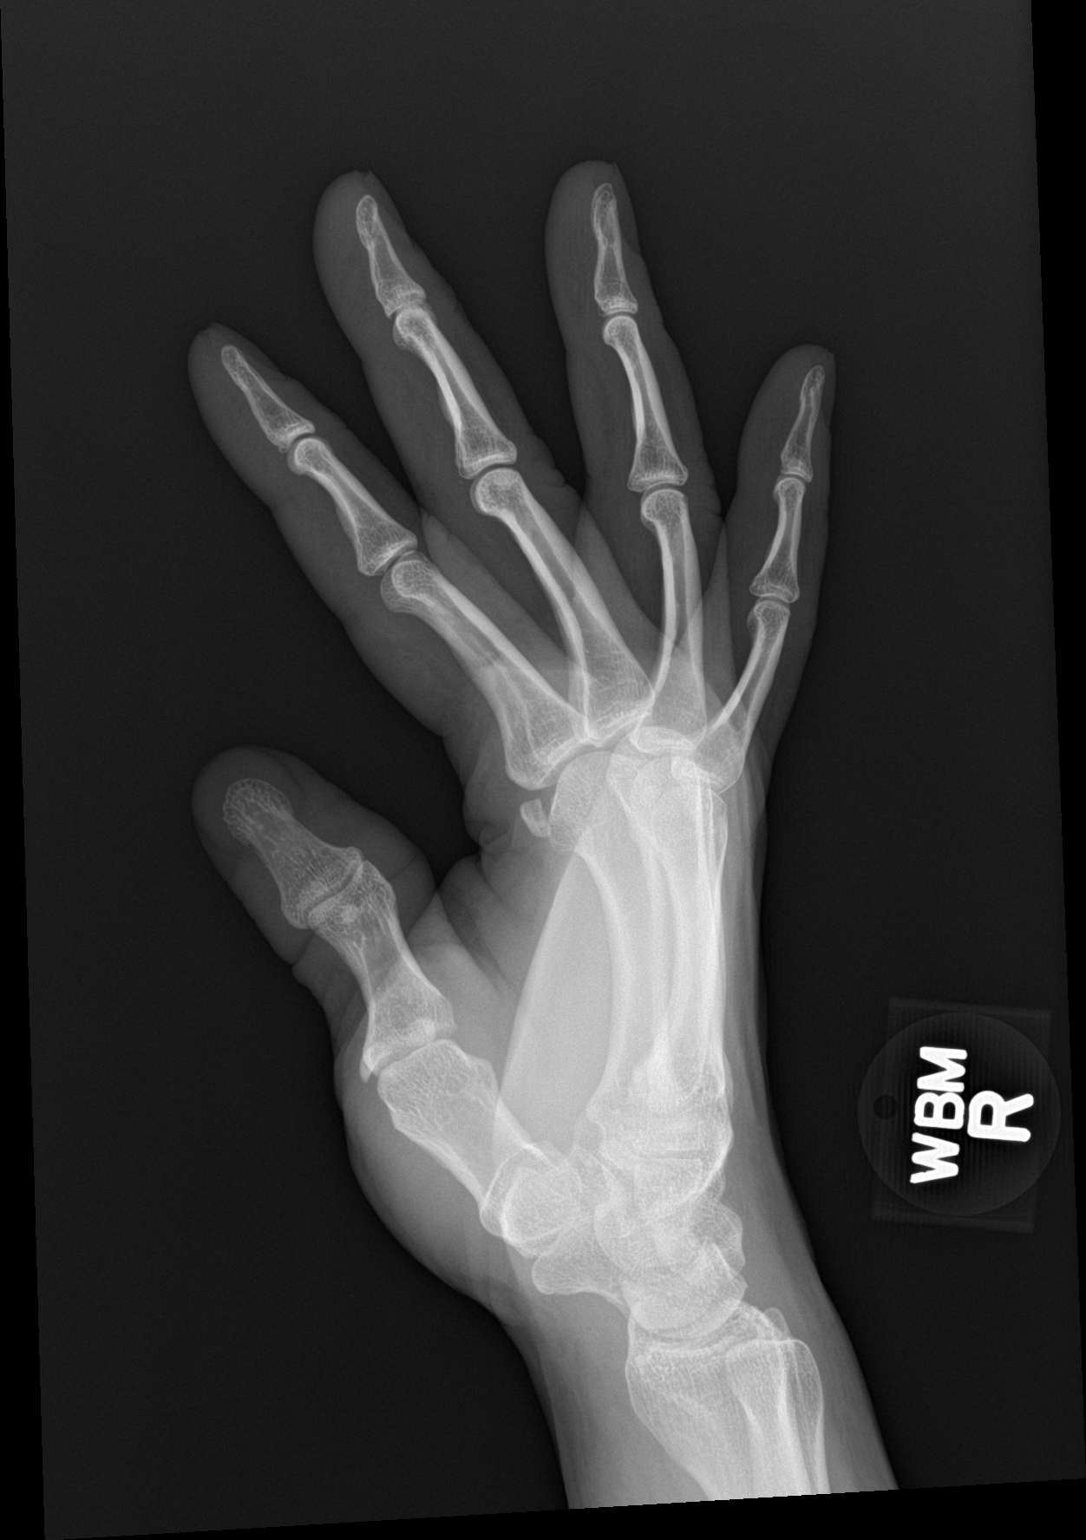

[3 of 3 positions shown; findings below may reference images not displayed]

FINDINGS: There is no evidence of fracture or dislocation. There is no
evidence of arthropathy or other focal bone abnormality. Soft
tissues are unremarkable.
IMPRESSION: Negative.

## 2021-07-13 ENCOUNTER — Other Ambulatory Visit: Payer: Self-pay | Admitting: Internal Medicine

## 2021-07-13 NOTE — Telephone Encounter (Signed)
Lipitor 40 mg refill request refused because it was discontinued 05/17/2021.  Pt on Crestor now.

## 2021-07-18 ENCOUNTER — Encounter: Payer: Commercial Managed Care - PPO | Admitting: Internal Medicine

## 2021-07-28 ENCOUNTER — Encounter: Payer: Commercial Managed Care - PPO | Admitting: Internal Medicine

## 2021-08-18 ENCOUNTER — Telehealth: Payer: Self-pay

## 2021-08-18 ENCOUNTER — Ambulatory Visit (INDEPENDENT_AMBULATORY_CARE_PROVIDER_SITE_OTHER): Payer: Commercial Managed Care - PPO | Admitting: Internal Medicine

## 2021-08-18 ENCOUNTER — Other Ambulatory Visit: Payer: Self-pay

## 2021-08-18 ENCOUNTER — Encounter: Payer: Self-pay | Admitting: Internal Medicine

## 2021-08-18 VITALS — BP 136/88 | HR 84 | Temp 97.3°F | Ht 68.0 in | Wt 207.0 lb

## 2021-08-18 DIAGNOSIS — Z1159 Encounter for screening for other viral diseases: Secondary | ICD-10-CM

## 2021-08-18 DIAGNOSIS — Z125 Encounter for screening for malignant neoplasm of prostate: Secondary | ICD-10-CM

## 2021-08-18 DIAGNOSIS — Z0001 Encounter for general adult medical examination with abnormal findings: Secondary | ICD-10-CM | POA: Diagnosis not present

## 2021-08-18 DIAGNOSIS — Z1211 Encounter for screening for malignant neoplasm of colon: Secondary | ICD-10-CM | POA: Diagnosis not present

## 2021-08-18 DIAGNOSIS — E6609 Other obesity due to excess calories: Secondary | ICD-10-CM

## 2021-08-18 DIAGNOSIS — Z6831 Body mass index (BMI) 31.0-31.9, adult: Secondary | ICD-10-CM

## 2021-08-18 MED ORDER — NA SULFATE-K SULFATE-MG SULF 17.5-3.13-1.6 GM/177ML PO SOLN: 1.0000 | Freq: Once | ORAL | 0 refills | Status: AC

## 2021-08-18 MED ORDER — TAMSULOSIN HCL 0.4 MG PO CAPS: 0.4000 mg | ORAL_CAPSULE | Freq: Every day | ORAL | 1 refills | Status: DC

## 2021-08-18 NOTE — Assessment & Plan Note (Signed)
Encouraged diet and exercise for weight loss ?

## 2021-08-18 NOTE — Telephone Encounter (Signed)
Gastroenterology Pre-Procedure Review  Request Date: 09/22/21 Requesting Physician: Dr. Tobi Bastos  PATIENT REVIEW QUESTIONS: The patient responded to the following health history questions as indicated:    1. Are you having any GI issues? no 2. Do you have a personal history of Polyps? no 3. Do you have a family history of Colon Cancer or Polyps? no 4. Diabetes Mellitus? no 5. Joint replacements in the past 12 months?no 6. Major health problems in the past 3 months? Cubital elbow surgery left 2023 early 7. Any artificial heart valves, MVP, or defibrillator?no    MEDICATIONS & ALLERGIES:    Patient reports the following regarding taking any anticoagulation/antiplatelet therapy:   Plavix, Coumadin, Eliquis, Xarelto, Lovenox, Pradaxa, Brilinta, or Effient? no Aspirin? no  Patient confirms/reports the following medications:  Current Outpatient Medications  Medication Sig Dispense Refill   Coenzyme Q10 (CO Q 10 PO) Take by mouth.     Cyanocobalamin (VITAMIN B-12) 500 MCG SUBL Place 1 tablet under the tongue daily. 100 mcg     Multiple Vitamins-Minerals (MULTIVITAMIN ADULT PO) Take 1 tablet by mouth daily.     rosuvastatin (CRESTOR) 10 MG tablet Take 1 tablet (10 mg total) by mouth daily. 90 tablet 1   tamsulosin (FLOMAX) 0.4 MG CAPS capsule Take 1 capsule (0.4 mg total) by mouth daily. 90 capsule 1   triamcinolone cream (KENALOG) 0.1 % APPLY TO AFFECTED AREA TWICE A DAY 80 g 1   No current facility-administered medications for this visit.    Patient confirms/reports the following allergies:  Allergies  Allergen Reactions   Penicillins Anaphylaxis    No orders of the defined types were placed in this encounter.   AUTHORIZATION INFORMATION Primary Insurance: 1D#: Group #:  Secondary Insurance: 1D#: Group #:  SCHEDULE INFORMATION: Date: 09/22/21 Time: Location: [facility]

## 2021-08-18 NOTE — Progress Notes (Signed)
Subjective:    Patient ID: Joseph Salazar, male    DOB: 1970/10/30, 51 y.o.   MRN: [id number]  HPI  Patient presents to clinic today for his annual exam.  Flu: 02/2017 Tetanus: 03/2016 COVID: Pfizer x 2 Shingrix: 06/2020, 08/2020 PSA screening: 04/2019 Colon screening: 06/2019 Vision screening: annually Dentist: biannually  Diet: He does eat meat. He consumes fruits and veggies. He does eat some fried foods. He drinks mostly water and coffee. Exercise: Walking  Review of Systems     Past Medical History:  Diagnosis Date   Herpes, genital    History of chicken pox    Hypercholesteremia     Current Outpatient Medications  Medication Sig Dispense Refill   Coenzyme Q10 (CO Q 10 PO) Take by mouth.     Cyanocobalamin (VITAMIN B-12) 500 MCG SUBL Place 1 tablet under the tongue daily. 100 mcg     gabapentin (NEURONTIN) 100 MG capsule Take 200 mg by mouth in the morning and at bedtime.     Multiple Vitamins-Minerals (MULTIVITAMIN ADULT PO) Take 1 tablet by mouth daily.     polyethylene glycol powder (MIRALAX) 17 GM/SCOOP powder Please specify directions, refills and quantity 255 g 0   rosuvastatin (CRESTOR) 10 MG tablet Take 1 tablet (10 mg total) by mouth daily. 90 tablet 1   tamsulosin (FLOMAX) 0.4 MG CAPS capsule TAKE 1 CAPSULE BY MOUTH EVERY DAY 90 capsule 3   triamcinolone cream (KENALOG) 0.1 % APPLY TO AFFECTED AREA TWICE A DAY 80 g 1   No current facility-administered medications for this visit.    Allergies  Allergen Reactions   Penicillins Anaphylaxis    Family History  Problem Relation Age of Onset   Lung cancer Mother    Diabetes Father    Hyperlipidemia Father    Heart disease Father    Heart disease Maternal Grandmother    Breast cancer Maternal Grandmother    Heart disease Paternal Grandmother    Diabetes Paternal Grandmother    Heart disease Paternal Grandfather     Social History   Socioeconomic History   Marital status: Divorced    Spouse  name: Not on file   Number of children: Not on file   Years of education: Not on file   Highest education level: Not on file  Occupational History   Not on file  Tobacco Use   Smoking status: Never   Smokeless tobacco: Never  Substance and Sexual Activity   Alcohol use: Yes    Comment: occasional   Drug use: No   Sexual activity: Not on file  Other Topics Concern   Not on file  Social History Narrative   Not on file   Social Determinants of Health   Financial Resource Strain: Not on file  Food Insecurity: Not on file  Transportation Needs: Not on file  Physical Activity: Not on file  Stress: Not on file  Social Connections: Not on file  Intimate Partner Violence: Not on file     Constitutional: Denies fever, malaise, fatigue, headache or abrupt weight changes.  HEENT: Denies eye pain, eye redness, ear pain, ringing in the ears, wax buildup, runny nose, nasal congestion, bloody nose, or sore throat. Respiratory: Denies difficulty breathing, shortness of breath, cough or sputum production.   Cardiovascular: Denies chest pain, chest tightness, palpitations or swelling in the hands or feet.  Gastrointestinal: Denies abdominal pain, bloating, constipation, diarrhea or blood in the stool.  GU: Patient reports urinary frequency.  Denies urgency, pain  with urination, burning sensation, blood in urine, odor or discharge. Musculoskeletal: Pt reports intermittent left elbow pain. Denies decrease in range of motion, difficulty with gait, muscle pain or joint swelling.  Skin: Denies redness, rashes, lesions or ulcercations.  Neurological: Pt reports hand tremor. Denies dizziness, difficulty with memory, difficulty with speech or problems with balance and coordination.  Psych: Denies anxiety, depression, SI/HI.  No other specific complaints in a complete review of systems (except as listed in HPI above).  Objective:   Physical Exam  BP 136/88 (BP Location: Left Arm, Patient Position:  Sitting, Cuff Size: Normal)   Pulse 84   Temp (!) 97.3 F (36.3 C) (Temporal)   Ht _0  (1.727 m)   Wt 207 lb (93.9 kg)   SpO2 97%   BMI 31.47 kg/m   Wt Readings from Last 3 Encounters:  05/16/21 214 lb (97.1 kg)  07/15/20 212 lb (96.2 kg)  06/23/19 205 lb (93 kg)    General: Appears his stated age,obese, in NAD. Skin: Warm, dry and intact.  HEENT: Head: normal shape and size; Eyes: sclera white, no icterus, conjunctiva pink, PERRLA and EOMs intact;  Neck:  Neck supple, trachea midline. No masses, lumps or thyromegaly present.  Cardiovascular: Normal rate and rhythm. S1,S2 noted.  No murmur, rubs or gallops noted. No JVD or BLE edema. No carotid bruits noted. Pulmonary/Chest: Normal effort and positive vesicular breath sounds. No respiratory distress. No wheezes, rales or ronchi noted.  Abdomen:  Normal bowel sounds.  Musculoskeletal: Strength 5/5 BUE/BLE.  No difficulty with gait.  Neurological: Alert and oriented. Cranial nerves II-XII grossly intact. Coordination normal.  Psychiatric: Mood and affect normal. Behavior is normal. Judgment and thought content normal.    BMET    Component Value Date/Time   NA 139 07/20/2020 0812   K 4.3 07/20/2020 0812   CL 102 07/20/2020 0812   CO2 28 07/20/2020 0812   GLUCOSE 91 07/20/2020 0812   BUN 16 07/20/2020 0812   CREATININE 1.05 07/20/2020 0812   CALCIUM 10.0 07/20/2020 0812   GFRNONAA 82 07/20/2020 0812   GFRAA 95 07/20/2020 0812    Lipid Panel     Component Value Date/Time   CHOL 209 (H) 05/16/2021 1004   TRIG 129 05/16/2021 1004   HDL 46 05/16/2021 1004   CHOLHDL 4.5 05/16/2021 1004   VLDL 36.6 05/12/2019 1614   LDLCALC 138 (H) 05/16/2021 1004    CBC    Component Value Date/Time   WBC 6.2 07/20/2020 0812   RBC 5.31 07/20/2020 0812   HGB 16.4 07/20/2020 0812   HCT 49.6 07/20/2020 0812   PLT 241 07/20/2020 0812   MCV 93.4 07/20/2020 0812   MCH 30.9 07/20/2020 0812   MCHC 33.1 07/20/2020 0812   RDW 12.6  07/20/2020 0812    Hgb A1C Lab Results  Component Value Date   HGBA1C 5.2 07/20/2020          Assessment & Plan:   Preventative Health Maintenance:  Encouraged him to get a flu shot in the fall Tetanus UTD Encouraged him to get a COVID-vaccine Shingrix UTD Referral to GI for screening colonoscopy Encouraged him to consume a balanced diet and exercise regimen Advised him to see an eye doctor and dentist annually We will check CBC, c-Met, lipid , PSA and hep C today  RTC in 6 months, follow-up chronic conditions Webb Silversmith, NP

## 2021-08-18 NOTE — Patient Instructions (Signed)
Health Maintenance, Male Adopting a healthy lifestyle and getting preventive care are important in promoting health and wellness. Ask your health care provider about: The right schedule for you to have regular tests and exams. Things you can do on your own to prevent diseases and keep yourself healthy. What should I know about diet, weight, and exercise? Eat a healthy diet  Eat a diet that includes plenty of vegetables, fruits, low-fat dairy products, and lean protein. Do not eat a lot of foods that are high in solid fats, added sugars, or sodium. Maintain a healthy weight Body mass index (BMI) is a measurement that can be used to identify possible weight problems. It estimates body fat based on height and weight. Your health care provider can help determine your BMI and help you achieve or maintain a healthy weight. Get regular exercise Get regular exercise. This is one of the most important things you can do for your health. Most adults should: Exercise for at least 150 minutes each week. The exercise should increase your heart rate and make you sweat (moderate-intensity exercise). Do strengthening exercises at least twice a week. This is in addition to the moderate-intensity exercise. Spend less time sitting. Even light physical activity can be beneficial. Watch cholesterol and blood lipids Have your blood tested for lipids and cholesterol at 52 years of age, then have this test every 5 years. You may need to have your cholesterol levels checked more often if: Your lipid or cholesterol levels are high. You are older than 52 years of age. You are at high risk for heart disease. What should I know about cancer screening? Many types of cancers can be detected early and may often be prevented. Depending on your health history and family history, you may need to have cancer screening at various ages. This may include screening for: Colorectal cancer. Prostate cancer. Skin cancer. Lung  cancer. What should I know about heart disease, diabetes, and high blood pressure? Blood pressure and heart disease High blood pressure causes heart disease and increases the risk of stroke. This is more likely to develop in people who have high blood pressure readings or are overweight. Talk with your health care provider about your target blood pressure readings. Have your blood pressure checked: Every 3-5 years if you are 18-39 years of age. Every year if you are 40 years old or older. If you are between the ages of 65 and 75 and are a current or former smoker, ask your health care provider if you should have a one-time screening for abdominal aortic aneurysm (AAA). Diabetes Have regular diabetes screenings. This checks your fasting blood sugar level. Have the screening done: Once every three years after age 45 if you are at a normal weight and have a low risk for diabetes. More often and at a younger age if you are overweight or have a high risk for diabetes. What should I know about preventing infection? Hepatitis B If you have a higher risk for hepatitis B, you should be screened for this virus. Talk with your health care provider to find out if you are at risk for hepatitis B infection. Hepatitis C Blood testing is recommended for: Everyone born from 1945 through 1965. Anyone with known risk factors for hepatitis C. Sexually transmitted infections (STIs) You should be screened each year for STIs, including gonorrhea and chlamydia, if: You are sexually active and are younger than 52 years of age. You are older than 52 years of age and your   health care provider tells you that you are at risk for this type of infection. Your sexual activity has changed since you were last screened, and you are at increased risk for chlamydia or gonorrhea. Ask your health care provider if you are at risk. Ask your health care provider about whether you are at high risk for HIV. Your health care provider  may recommend a prescription medicine to help prevent HIV infection. If you choose to take medicine to prevent HIV, you should first get tested for HIV. You should then be tested every 3 months for as long as you are taking the medicine. Follow these instructions at home: Alcohol use Do not drink alcohol if your health care provider tells you not to drink. If you drink alcohol: Limit how much you have to 0-2 drinks a day. Know how much alcohol is in your drink. In the [place] one drink equals one 12 oz bottle of beer (355 mL), one 5 oz glass of wine (148 mL), or one 1 oz glass of hard liquor (44 mL). Lifestyle Do not use any products that contain nicotine or tobacco. These products include cigarettes, chewing tobacco, and vaping devices, such as e-cigarettes. If you need help quitting, ask your health care provider. Do not use street drugs. Do not share needles. Ask your health care provider for help if you need support or information about quitting drugs. General instructions Schedule regular health, dental, and eye exams. Stay current with your vaccines. Tell your health care provider if: You often feel depressed. You have ever been abused or do not feel safe at home. Summary Adopting a healthy lifestyle and getting preventive care are important in promoting health and wellness. Follow your health care provider's instructions about healthy diet, exercising, and getting tested or screened for diseases. Follow your health care provider's instructions on monitoring your cholesterol and blood pressure. This information is not intended to replace advice given to you by your health care provider. Make sure you discuss any questions you have with your health care provider. Document Revised: 05/24/2020 Document Reviewed: 05/24/2020 Elsevier Patient Education  2023 Elsevier Inc.  

## 2021-08-19 LAB — COMPLETE METABOLIC PANEL WITH GFR
AG Ratio: 2 (calc) (ref 1.0–2.5)
ALT: 31 U/L (ref 9–46)
AST: 22 U/L (ref 10–35)
Albumin: 4.7 g/dL (ref 3.6–5.1)
Alkaline phosphatase (APISO): 74 U/L (ref 35–144)
BUN: 17 mg/dL (ref 7–25)
CO2: 25 mmol/L (ref 20–32)
Calcium: 9.4 mg/dL (ref 8.6–10.3)
Chloride: 104 mmol/L (ref 98–110)
Creat: 0.98 mg/dL (ref 0.70–1.30)
Globulin: 2.4 g/dL (calc) (ref 1.9–3.7)
Glucose, Bld: 80 mg/dL (ref 65–139)
Potassium: 4.3 mmol/L (ref 3.5–5.3)
Sodium: 139 mmol/L (ref 135–146)
Total Bilirubin: 0.7 mg/dL (ref 0.2–1.2)
Total Protein: 7.1 g/dL (ref 6.1–8.1)
eGFR: 93 mL/min/{1.73_m2} (ref >=60)

## 2021-08-19 LAB — CBC
HCT: 48.1 % (ref 38.5–50.0)
Hemoglobin: 16.5 g/dL (ref 13.2–17.1)
MCH: 30.9 pg (ref 27.0–33.0)
MCHC: 34.3 g/dL (ref 32.0–36.0)
MCV: 90.1 fL (ref 80.0–100.0)
MPV: 11.2 fL (ref 7.5–12.5)
Platelets: 209 10*3/uL (ref 140–400)
RBC: 5.34 10*6/uL (ref 4.20–5.80)
RDW: 12.6 % (ref 11.0–15.0)
WBC: 6.8 10*3/uL (ref 3.8–10.8)

## 2021-08-19 LAB — LIPID PANEL
Cholesterol: 224 mg/dL — ABNORMAL HIGH (ref <200)
HDL: 44 mg/dL (ref >=40)
LDL Cholesterol (Calc): 154 mg/dL (calc) — ABNORMAL HIGH
Non-HDL Cholesterol (Calc): 180 mg/dL (calc) — ABNORMAL HIGH (ref <130)
Total CHOL/HDL Ratio: 5.1 (calc) — ABNORMAL HIGH (ref <5.0)
Triglycerides: 137 mg/dL (ref <150)

## 2021-08-19 LAB — PSA: PSA: 2.23 ng/mL (ref <=4.00)

## 2021-08-19 LAB — HEPATITIS C ANTIBODY: Hepatitis C Ab: NONREACTIVE

## 2021-09-22 ENCOUNTER — Encounter: Payer: Self-pay | Admitting: Gastroenterology

## 2021-09-22 ENCOUNTER — Ambulatory Visit: Payer: Commercial Managed Care - PPO

## 2021-09-22 ENCOUNTER — Other Ambulatory Visit: Payer: Self-pay

## 2021-09-22 ENCOUNTER — Ambulatory Visit
Admission: RE | Admit: 2021-09-22 | Discharge: 2021-09-22 | Disposition: A | Discharge status: Home / Self Care | Payer: Commercial Managed Care - PPO | Source: Ambulatory Visit | Attending: Gastroenterology | Admitting: Gastroenterology

## 2021-09-22 ENCOUNTER — Encounter
Admission: RE | Disposition: A | Discharge status: Home / Self Care | Payer: Self-pay | Source: Ambulatory Visit | Attending: Gastroenterology

## 2021-09-22 DIAGNOSIS — E7800 Pure hypercholesterolemia, unspecified: Secondary | ICD-10-CM | POA: Diagnosis not present

## 2021-09-22 DIAGNOSIS — Z8719 Personal history of other diseases of the digestive system: Secondary | ICD-10-CM | POA: Diagnosis not present

## 2021-09-22 DIAGNOSIS — Z1211 Encounter for screening for malignant neoplasm of colon: Secondary | ICD-10-CM | POA: Diagnosis not present

## 2021-09-22 DIAGNOSIS — Z9852 Vasectomy status: Secondary | ICD-10-CM | POA: Diagnosis not present

## 2021-09-22 HISTORY — PX: COLONOSCOPY WITH PROPOFOL: SHX5780

## 2021-09-22 SURGERY — COLONOSCOPY WITH PROPOFOL
Anesthesia: General

## 2021-09-22 MED ORDER — DEXMEDETOMIDINE (PRECEDEX) IN NS 20 MCG/5ML (4 MCG/ML) IV SYRINGE
PREFILLED_SYRINGE | INTRAVENOUS | Status: DC | PRN
  Administered 2021-09-22: 8 ug via INTRAVENOUS

## 2021-09-22 MED ORDER — PROPOFOL 500 MG/50ML IV EMUL
INTRAVENOUS | Status: DC | PRN
  Administered 2021-09-22: 140 ug/kg/min via INTRAVENOUS

## 2021-09-22 MED ORDER — PROPOFOL 10 MG/ML IV BOLUS
INTRAVENOUS | Status: AC
  Filled 2021-09-22: qty 20

## 2021-09-22 MED ORDER — PROPOFOL 1000 MG/100ML IV EMUL
INTRAVENOUS | Status: AC
  Filled 2021-09-22: qty 200

## 2021-09-22 MED ORDER — SODIUM CHLORIDE 0.9 % IV SOLN: INTRAVENOUS | Status: DC

## 2021-09-22 MED ORDER — PROPOFOL 10 MG/ML IV BOLUS
INTRAVENOUS | Status: DC | PRN
  Administered 2021-09-22: 80 mg via INTRAVENOUS

## 2021-09-22 MED ORDER — LIDOCAINE HCL (CARDIAC) PF 100 MG/5ML IV SOSY
PREFILLED_SYRINGE | INTRAVENOUS | Status: DC | PRN
  Administered 2021-09-22: 60 mg via INTRAVENOUS

## 2021-09-22 MED ORDER — LIDOCAINE HCL (PF) 2 % IJ SOLN
INTRAMUSCULAR | Status: AC
  Filled 2021-09-22: qty 5

## 2021-09-22 NOTE — H&P (Signed)
[name], MD [address], [address], [place], [place], [address] [address], [place], [place], [address] Phone: [phone]  Fax: [phone]  Primary Care Physician:  Lorre Munroe, NP   Pre-Procedure History & Physical: HPI:  Joseph Salazar is a 51 y.o. male is here for an colonoscopy.   Past Medical History:  Diagnosis Date   Herpes, genital    History of chicken pox    Hypercholesteremia     Past Surgical History:  Procedure Laterality Date   COLONOSCOPY WITH PROPOFOL N/A 06/23/2019   Procedure: COLONOSCOPY WITH PROPOFOL;  Surgeon: [name], MD;  Location: [facility] ENDOSCOPY;  Service: Gastroenterology;  Laterality: N/A;   VASECTOMY      Prior to Admission medications   Medication Sig Start Date End Date Taking? Authorizing Provider  Coenzyme Q10 (CO Q 10 PO) Take by mouth.   Yes [provider]  Cyanocobalamin (VITAMIN B-12) 500 MCG SUBL Place 1 tablet under the tongue daily. 100 mcg   Yes [provider]  Multiple Vitamins-Minerals (MULTIVITAMIN ADULT PO) Take 1 tablet by mouth daily.   Yes [provider]  rosuvastatin (CRESTOR) 10 MG tablet Take 1 tablet (10 mg total) by mouth daily. 05/17/21  Yes Lorre Munroe, NP  tamsulosin (FLOMAX) 0.4 MG CAPS capsule Take 1 capsule (0.4 mg total) by mouth daily. 08/18/21  Yes Baity, Salvadore Oxford, NP  triamcinolone cream (KENALOG) 0.1 % APPLY TO AFFECTED AREA TWICE A DAY 07/15/20   Lorre Munroe, NP    Allergies as of 08/19/2021 - Review Complete 08/18/2021  Allergen Reaction Noted   Penicillins Anaphylaxis 02/22/2017    Family History  Problem Relation Age of Onset   Lung cancer Mother    Diabetes Father    Hyperlipidemia Father    Heart disease Father    Heart disease Maternal Grandmother    Breast cancer Maternal Grandmother    Heart disease Paternal Grandmother    Diabetes Paternal Grandmother    Heart disease Paternal Grandfather     Social History    Socioeconomic History   Marital status: Divorced    Spouse name: Not on file   Number of children: Not on file   Years of education: Not on file   Highest education level: Not on file  Occupational History   Not on file  Tobacco Use   Smoking status: Never   Smokeless tobacco: Never  Vaping Use   Vaping Use: Never used  Substance and Sexual Activity   Alcohol use: Yes    Comment: occasional,none last 24 hrs   Drug use: No   Sexual activity: Not on file  Other Topics Concern   Not on file  Social History Narrative   Not on file   Social Determinants of Health   Financial Resource Strain: Not on file  Food Insecurity: Not on file  Transportation Needs: Not on file  Physical Activity: Not on file  Stress: Not on file  Social Connections: Not on file  Intimate Partner Violence: Not on file    Review of Systems: See HPI, otherwise negative ROS  Physical Exam: BP (!) 124/100   Pulse 82   Temp (!) 96.4 F (35.8 C) (Temporal)   Resp 20   Ht 5\' 8"  (1.727 m)   Wt 93 kg   SpO2 98%   BMI 31.17 kg/m  General:   Alert,  pleasant and cooperative in NAD Head:  Normocephalic and atraumatic. Neck:  Supple;  no masses or thyromegaly. Lungs:  Clear throughout to auscultation, normal respiratory effort.    Heart:  +S1, +S2, Regular rate and rhythm, No edema. Abdomen:  Soft, nontender and nondistended. Normal bowel sounds, without guarding, and without rebound.   Neurologic:  Alert and  oriented x4;  grossly normal neurologically.  Impression/Plan: Joseph Salazar is here for an colonoscopy to be performed for Screening colonoscopy average risk   Risks, benefits, limitations, and alternatives regarding  colonoscopy have been reviewed with the patient.  Questions have been answered.  All parties agreeable.   [name], MD  09/22/2021, 8:00 AM

## 2021-09-22 NOTE — Op Note (Signed)
[facility] Gastroenterology Patient Name: Joseph Salazar Procedure Date: 09/22/2021 8:06 AM MRN: [id number] Account #: [id number] Date of Birth: Oct 13, 1970 Admit Type: Outpatient Age: 51 Room: [facility] ENDO ROOM 3 Gender: Male Note Status: Finalized Instrument Name: Prentice Docker [id number] Procedure:             Colonoscopy Indications:           Screening for colorectal malignant neoplasm Providers:             [name] MD, MD Referring MD:          Lorre Munroe (Referring MD) Medicines:             Monitored Anesthesia Care Complications:         No immediate complications. Procedure:             Pre-Anesthesia Assessment:                        - Prior to the procedure, a History and Physical was                         performed, and patient medications, allergies and                         sensitivities were reviewed. The patient's tolerance                         of previous anesthesia was reviewed.                        - The risks and benefits of the procedure and the                         sedation options and risks were discussed with the                         patient. All questions were answered and informed                         consent was obtained.                        - ASA Grade Assessment: II - A patient with mild                         systemic disease.                        After obtaining informed consent, the colonoscope was                         passed under direct vision. Throughout the procedure,                         the patient's blood pressure, pulse, and oxygen                         saturations were monitored continuously. The                         Colonoscope was introduced  through the anus and                         advanced to the the cecum, identified by the                         appendiceal orifice. The colonoscopy was performed                         with ease. The patient tolerated the procedure well.                          The quality of the bowel preparation was excellent. Findings:      The perianal and digital rectal examinations were normal.      The entire examined colon appeared normal on direct and retroflexion       views. Impression:            - The entire examined colon is normal on direct and                         retroflexion views.                        - No specimens collected. Recommendation:        - Discharge patient to home (with escort).                        - Resume previous diet.                        - Continue present medications.                        - Repeat colonoscopy in 10 years for screening                         purposes. Procedure Code(s):     --- Professional ---                        [phone], Colonoscopy, flexible; diagnostic, including                         collection of specimen(s) by brushing or washing, when                         performed (separate procedure) Diagnosis Code(s):     --- Professional ---                        Z12.11, Encounter for screening for malignant neoplasm                         of colon CPT copyright 2019 American Medical Association. All rights reserved. The codes documented in this report are preliminary and upon coder review may  be revised to meet current compliance requirements. [name], MD [name] MD, MD 09/22/2021 8:28:11 AM This report has been signed electronically. Number of Addenda: 0 Note Initiated On: 09/22/2021 8:06 AM Scope Withdrawal Time: 0 hours 8 minutes 5 seconds  Total Procedure Duration: 0 hours 10  minutes 39 seconds  Estimated Blood Loss:  Estimated blood loss: none.      [facility]

## 2021-09-22 NOTE — Anesthesia Preprocedure Evaluation (Signed)
Anesthesia Evaluation  Patient identified by MRN, date of birth, ID band Patient awake    Reviewed: Allergy & Precautions, H&P , NPO status , Patient's Chart, lab work & pertinent test results, reviewed documented beta blocker date and time   History of Anesthesia Complications Negative for: history of anesthetic complications  Airway Mallampati: II   Neck ROM: full    Dental  (+) Poor Dentition, Dental Advidsory Given   Pulmonary neg pulmonary ROS,    Pulmonary exam normal        Cardiovascular Exercise Tolerance: Good negative cardio ROS Normal cardiovascular exam Rhythm:regular Rate:Normal     Neuro/Psych negative neurological ROS  negative psych ROS   GI/Hepatic negative GI ROS, Neg liver ROS,   Endo/Other  negative endocrine ROS  Renal/GU negative Renal ROS  negative genitourinary   Musculoskeletal   Abdominal   Peds  Hematology negative hematology ROS (+)   Anesthesia Other Findings Past Medical History: No date: Herpes, genital No date: History of chicken pox No date: Hypercholesteremia Past Surgical History: No date: VASECTOMY BMI    Body Mass Index: 31.17 kg/m     Reproductive/Obstetrics negative OB ROS                             Anesthesia Physical  Anesthesia Plan  ASA: II  Anesthesia Plan: General   Post-op Pain Management: Minimal or no pain anticipated   Induction: Intravenous  PONV Risk Score and Plan: 3 and Propofol infusion  Airway Management Planned: Nasal Cannula  Additional Equipment: None  Intra-op Plan:   Post-operative Plan:   Informed Consent: I have reviewed the patients History and Physical, chart, labs and discussed the procedure including the risks, benefits and alternatives for the proposed anesthesia with the patient or authorized representative who has indicated his/her understanding and acceptance.     Dental Advisory  Given  Plan Discussed with: CRNA  Anesthesia Plan Comments: (Discussed risks of anesthesia with patient, including possibility of difficulty with spontaneous ventilation under anesthesia necessitating airway intervention, PONV, and rare risks such as cardiac or respiratory or neurological events, and allergic reactions. Discussed the role of CRNA in patient's perioperative care. Patient understands.)        Anesthesia Quick Evaluation

## 2021-09-22 NOTE — Transfer of Care (Signed)
Immediate Anesthesia Transfer of Care Note  Patient: Joseph Salazar [facility]  Procedure(s) Performed: COLONOSCOPY WITH PROPOFOL  Patient Location: PACU  Anesthesia Type:General  Level of Consciousness: awake, alert  and oriented  Airway & Oxygen Therapy: Patient Spontanous Breathing  Post-op Assessment: Report given to RN and Post -op Vital signs reviewed and stable  Post vital signs: Reviewed and stable  Last Vitals:  Vitals Value Taken Time  BP 108/81 09/22/21 0829  Temp 35.7 C 09/22/21 0829  Pulse 76 09/22/21 0830  Resp 11 09/22/21 0830  SpO2 93 % 09/22/21 0830  Vitals shown include unvalidated device data.  Last Pain:  Vitals:   09/22/21 0829  TempSrc: Temporal  PainSc: Asleep         Complications: No notable events documented.

## 2021-09-22 NOTE — Anesthesia Postprocedure Evaluation (Signed)
Anesthesia Post Note  Patient: Joseph Salazar [facility]  Procedure(s) Performed: COLONOSCOPY WITH PROPOFOL  Patient location during evaluation: Endoscopy Anesthesia Type: General Level of consciousness: awake and alert Pain management: pain level controlled Vital Signs Assessment: post-procedure vital signs reviewed and stable Respiratory status: spontaneous breathing, nonlabored ventilation, respiratory function stable and patient connected to nasal cannula oxygen Cardiovascular status: blood pressure returned to baseline and stable Postop Assessment: no apparent nausea or vomiting Anesthetic complications: no   No notable events documented.   Last Vitals:  Vitals:   09/22/21 0839 09/22/21 0849  BP: 102/77 117/86  Pulse: 72 69  Resp: 15 (!) 21  Temp:    SpO2: 97% 95%    Last Pain:  Vitals:   09/22/21 0849  TempSrc:   PainSc: 0-No pain                 Stephanie Coup

## 2021-09-23 ENCOUNTER — Encounter: Payer: Self-pay | Admitting: Gastroenterology

## 2021-10-19 ENCOUNTER — Encounter: Payer: Self-pay | Admitting: Internal Medicine

## 2021-11-19 ENCOUNTER — Other Ambulatory Visit: Payer: Self-pay | Admitting: Internal Medicine

## 2021-11-21 NOTE — Telephone Encounter (Signed)
Requested Prescriptions  Pending Prescriptions Disp Refills   rosuvastatin (CRESTOR) 10 MG tablet [Pharmacy Med Name: ROSUVASTATIN CALCIUM 10 MG TAB] 90 tablet 0    Sig: TAKE 1 TABLET BY MOUTH EVERY DAY     Cardiovascular:  Antilipid - Statins 2 Failed - 11/19/2021  9:49 AM      Failed - Lipid Panel in normal range within the last 12 months    Cholesterol  Date Value Ref Range Status  08/18/2021 224 (H) <200 mg/dL Final   LDL Cholesterol (Calc)  Date Value Ref Range Status  08/18/2021 154 (H) mg/dL (calc) Final    Comment:    Reference range: <100 . Desirable range <100 mg/dL for primary prevention;   <70 mg/dL for patients with CHD or diabetic patients  with > or = 2 CHD risk factors. Marland Kitchen LDL-C is now calculated using the Martin-Hopkins  calculation, which is a validated novel method providing  better accuracy than the Friedewald equation in the  estimation of LDL-C.  Cresenciano Genre et al. Annamaria Helling. 0347;425(95): 2061-2068  (http://education.[facility].com/faq/FAQ164)    HDL  Date Value Ref Range Status  08/18/2021 44 > OR = 40 mg/dL Final   Triglycerides  Date Value Ref Range Status  08/18/2021 137 <150 mg/dL Final         Passed - Cr in normal range and within 360 days    Creat  Date Value Ref Range Status  08/18/2021 0.98 0.70 - 1.30 mg/dL Final         Passed - Patient is not pregnant      Passed - Valid encounter within last 12 months    Recent Outpatient Visits           3 months ago Screening for colon cancer   [place], Coralie Keens, NP   6 months ago Other fatigue   [facility] [place], Coralie Keens, NP   1 year ago Encounter for general adult medical examination with abnormal findings   [facility] [place], Coralie Keens, NP

## 2022-01-24 ENCOUNTER — Encounter: Payer: Self-pay | Admitting: Internal Medicine

## 2022-01-24 ENCOUNTER — Ambulatory Visit (INDEPENDENT_AMBULATORY_CARE_PROVIDER_SITE_OTHER): Payer: Commercial Managed Care - PPO | Admitting: Internal Medicine

## 2022-01-24 VITALS — BP 134/78 | HR 99 | Temp 98.2°F | Wt 208.0 lb

## 2022-01-24 DIAGNOSIS — R519 Headache, unspecified: Secondary | ICD-10-CM

## 2022-01-24 DIAGNOSIS — H6121 Impacted cerumen, right ear: Secondary | ICD-10-CM

## 2022-01-24 DIAGNOSIS — J3489 Other specified disorders of nose and nasal sinuses: Secondary | ICD-10-CM

## 2022-01-24 DIAGNOSIS — H6501 Acute serous otitis media, right ear: Secondary | ICD-10-CM

## 2022-01-24 MED ORDER — DOXYCYCLINE HYCLATE 100 MG PO TABS: 100.0000 mg | ORAL_TABLET | Freq: Two times a day (BID) | ORAL | 0 refills | Status: DC

## 2022-01-24 NOTE — Progress Notes (Signed)
Subjective:    Patient ID: Joseph Salazar, male    DOB: 18-Nov-1970, 52 y.o.   MRN: [id number]  HPI  Patient presents to clinic today with complaint of headache, sinus pressure, and decreased hearing out of his right ear.  This started 10 days ago.  He reports the headache was throbbing yesterday.  He denies runny nose, nasal congestion, dental pain, sore throat, cough, shortness of breath, nausea, vomiting or diarrhea.  He denies fever, chills or body aches.  He has tried Tylenol, Theraflu and Mucinex with minimal relief of symptoms. He has had sick contacts but has not been around anyone diagnosed with covid or flu.  Review of Systems     Past Medical History:  Diagnosis Date   Herpes, genital    History of chicken pox    Hypercholesteremia     Current Outpatient Medications  Medication Sig Dispense Refill   Coenzyme Q10 (CO Q 10 PO) Take by mouth.     Cyanocobalamin (VITAMIN B-12) 500 MCG SUBL Place 1 tablet under the tongue daily. 100 mcg     Multiple Vitamins-Minerals (MULTIVITAMIN ADULT PO) Take 1 tablet by mouth daily.     rosuvastatin (CRESTOR) 10 MG tablet TAKE 1 TABLET BY MOUTH EVERY DAY 90 tablet 0   tamsulosin (FLOMAX) 0.4 MG CAPS capsule Take 1 capsule (0.4 mg total) by mouth daily. 90 capsule 1   triamcinolone cream (KENALOG) 0.1 % APPLY TO AFFECTED AREA TWICE A DAY 80 g 1   No current facility-administered medications for this visit.    Allergies  Allergen Reactions   Penicillins Anaphylaxis    Family History  Problem Relation Age of Onset   Lung cancer Mother    Diabetes Father    Hyperlipidemia Father    Heart disease Father    Heart disease Maternal Grandmother    Breast cancer Maternal Grandmother    Heart disease Paternal Grandmother    Diabetes Paternal Grandmother    Heart disease Paternal Grandfather     Social History   Socioeconomic History   Marital status: Divorced    Spouse name: Not on file   Number of children: Not on file    Years of education: Not on file   Highest education level: Not on file  Occupational History   Not on file  Tobacco Use   Smoking status: Never   Smokeless tobacco: Never  Vaping Use   Vaping Use: Never used  Substance and Sexual Activity   Alcohol use: Yes    Comment: occasional,none last 24 hrs   Drug use: No   Sexual activity: Not on file  Other Topics Concern   Not on file  Social History Narrative   Not on file   Social Determinants of Health   Financial Resource Strain: Not on file  Food Insecurity: Not on file  Transportation Needs: Not on file  Physical Activity: Not on file  Stress: Not on file  Social Connections: Not on file  Intimate Partner Violence: Not on file     Constitutional: Patient reports headache.  Denies fever, malaise, fatigue, or abrupt weight changes.  HEENT: Patient reports decreased hearing out of right ear, sinus pressure.  Denies eye pain, eye redness, ear pain, ringing in the ears, wax buildup, runny nose, nasal congestion, bloody nose, or sore throat. Respiratory: Denies difficulty breathing, shortness of breath, cough or sputum production.   Cardiovascular: Denies chest pain, chest tightness, palpitations or swelling in the hands or feet.  Gastrointestinal: Denies  abdominal pain, bloating, constipation, diarrhea or blood in the stool.  Neurological: Denies dizziness, difficulty with memory, difficulty with speech or problems with balance and coordination.    No other specific complaints in a complete review of systems (except as listed in HPI above).  Objective:   Physical Exam  BP 134/78 (BP Location: Right Arm, Patient Position: Sitting, Cuff Size: Large)   Pulse 99   Temp 98.2 F (36.8 C) (Oral)   Wt 208 lb (94.3 kg)   SpO2 97%   BMI 31.63 kg/m   Wt Readings from Last 3 Encounters:  09/22/21 205 lb (93 kg)  08/18/21 207 lb (93.9 kg)  05/16/21 214 lb (97.1 kg)    General: Appears his stated age, obese, in NAD. Skin: Warm,  dry and intact. No rashes noted. HEENT: Head: normal shape and size, no sinus tenderness noted; Eyes: sclera white, no icterus, conjunctiva pink, PERRLA and EOMs intact; Left Ear: Tm's gray and intact, normal light reflex; Right Ear: Cerumen impaction; Throat/Mouth: Teeth present, mucosa pink and moist, no exudate, lesions or ulcerations noted.  Neck: No adenopathy noted. Cardiovascular: Normal rate and rhythm. S1,S2 noted.  No murmur, rubs or gallops noted.  Pulmonary/Chest: Normal effort and positive vesicular breath sounds. No respiratory distress. No wheezes, rales or ronchi noted.  Neurological: Alert and oriented.   BMET    Component Value Date/Time   NA 139 08/18/2021 1526   K 4.3 08/18/2021 1526   CL 104 08/18/2021 1526   CO2 25 08/18/2021 1526   GLUCOSE 80 08/18/2021 1526   BUN 17 08/18/2021 1526   CREATININE 0.98 08/18/2021 1526   CALCIUM 9.4 08/18/2021 1526   GFRNONAA 82 07/20/2020 0812   GFRAA 95 07/20/2020 0812    Lipid Panel     Component Value Date/Time   CHOL 224 (H) 08/18/2021 1526   TRIG 137 08/18/2021 1526   HDL 44 08/18/2021 1526   CHOLHDL 5.1 (H) 08/18/2021 1526   VLDL 36.6 05/12/2019 1614   LDLCALC 154 (H) 08/18/2021 1526    CBC    Component Value Date/Time   WBC 6.8 08/18/2021 1526   RBC 5.34 08/18/2021 1526   HGB 16.5 08/18/2021 1526   HCT 48.1 08/18/2021 1526   PLT 209 08/18/2021 1526   MCV 90.1 08/18/2021 1526   MCH 30.9 08/18/2021 1526   MCHC 34.3 08/18/2021 1526   RDW 12.6 08/18/2021 1526    Hgb A1C Lab Results  Component Value Date   HGBA1C 5.2 07/20/2020            Assessment & Plan:   Acute Headache, Sinus Pressure, Decreased Hearing Right Ear secondary to Cerumen Impaction:  Manual lavage by CMA Advised him to try Debrox OTC 2 times a week to prevent wax buildup Eardrum post lavage is red and irritated Will treat with Doxcycline 100 mg BID x 10 days for presumed otitis media   RTC in 1 month for follow-up of chronic  conditions Webb Silversmith, NP

## 2022-01-24 NOTE — Patient Instructions (Signed)
Ear Irrigation Ear irrigation is a procedure to wash dirt and wax out of your ear canal. This procedure is also called lavage. You may need ear irrigation if you are having trouble hearing because of a buildup of earwax. You may also have ear irrigation as part of the treatment for an ear infection. Getting wax and dirt out of your ear canal can help ear drops work better. Tell a health care provider about: Any allergies you have. All medicines you are taking, including vitamins, herbs, eye drops, creams, and over-the-counter medicines. Any problems you or family members have had with anesthetic medicines. Any blood disorders you have. Any surgeries you have had. This includes any ear surgeries. Any medical conditions you have. Whether you are pregnant or may be pregnant. What are the risks? Generally, this is a safe procedure. However, problems may occur, including: Infection. Pain. Hearing loss. Fluid and debris being pushed through the eardrum and into the middle ear. This can occur if there are holes in the eardrum. Ear irrigation failing to work. What happens before the procedure? You will talk with your provider about the procedure and plan. You may be given ear drops to put in your ear 15-20 minutes before irrigation. This helps loosen the wax. What happens during the procedure?  A syringe is filled with water or saline solution, which is made of salt and water. The syringe is gently inserted into the ear canal. The fluid is used to flush out wax and other debris. The procedure may vary among health care providers and hospitals. What can I expect after the procedure? After an ear irrigation, follow instructions given to you by your health care provider. Follow these instructions at home: Using ear irrigation kits Ear irrigation kits are available for use at home. Ask your health care provider if this is an option for you. In general, you should: Use a home irrigation kit only as  told by your health care provider. Read the package instructions carefully. Follow the directions for using the syringe. Use water that is room temperature. Do not do ear irrigation at home if you: Have diabetes. Diabetes increases the risk of infection. Have a hole or tear in your eardrum. Have tubes in your ears. Have had any ear surgery in the past. Have been told not to irrigate your ears. Cleaning your ears  Clean the outside of your ear with a soft washcloth daily. If told by your health care provider, use a few drops of baby oil, mineral oil, glycerin, hydrogen peroxide, or over-the-counter earwax softening drops. Do not use cotton swabs to clean your ears. These can push wax down into the ear canal. Do not put anything into your ears to try to remove wax. This includes ear candles. General instructions Take over-the-counter and prescription medicines only as told by your health care provider. If you were prescribed an antibiotic medicine, use it as told by your health care provider. Do not stop using the antibiotic even if your condition improves. Keep the ear clean and dry by following the instructions from your health care provider. Keep all follow-up visits. This is important. Visit your health care provider at least once a year to have your ears and hearing checked. Contact a health care provider if: Your hearing is not improving or is getting worse. You have pain or redness in your ear. You are dizzy. You have ringing in your ears. You have nausea or vomiting. You have fluid, blood, or pus coming out   of your ear. Summary Ear irrigation is a procedure to wash dirt and wax out of your ear canal. This procedure is also called lavage. To perform ear irrigation, ear drops may be put in your ear 15-20 minutes before irrigation. Water or saline solution will be used to flush out earwax and other debris. You may be able to irrigate your ears at home. Ask your health care provider  if this is an option for you. Follow your health care provider's instructions. Clean your ears with a soft cloth after irrigation. Do not use cotton swabs to clean your ears. These can push wax down into the ear canal. This information is not intended to replace advice given to you by your health care provider. Make sure you discuss any questions you have with your health care provider. Document Revised: 04/22/2019 Document Reviewed: 04/22/2019 Elsevier Patient Education  2023 Elsevier Inc.  

## 2022-02-18 ENCOUNTER — Other Ambulatory Visit: Payer: Self-pay | Admitting: Internal Medicine

## 2022-02-20 NOTE — Telephone Encounter (Signed)
Requested medication (s) are due for refill today: yes  Requested medication (s) are on the active medication list: yes  Last refill:  flomax-08/18/21 #90 1 refills, crestor- 12/01/21 #90 0 refills  Future visit scheduled: no  Notes to clinic:  no refills remain. Do you want to refill Rxs?     Requested Prescriptions  Pending Prescriptions Disp Refills   tamsulosin (FLOMAX) 0.4 MG CAPS capsule [Pharmacy Med Name: TAMSULOSIN HCL 0.4 MG CAPSULE] 90 capsule 1    Sig: TAKE 1 CAPSULE BY MOUTH EVERY DAY     Urology: Alpha-Adrenergic Blocker Passed - 02/18/2022  8:26 AM      Passed - PSA in normal range and within 360 days    PSA  Date Value Ref Range Status  08/18/2021 2.23 < OR = 4.00 ng/mL Final    Comment:    The total PSA value from this assay system is  standardized against the WHO standard. The test  result will be approximately 20% lower when compared  to the equimolar-standardized total PSA (Beckman  Coulter). Comparison of serial PSA results should be  interpreted with this fact in mind. . This test was performed using the Siemens  chemiluminescent method. Values obtained from  different assay methods cannot be used interchangeably. PSA levels, regardless of value, should not be interpreted as absolute evidence of the presence or absence of disease.          Passed - Last BP in normal range    BP Readings from Last 1 Encounters:  01/24/22 134/78         Passed - Valid encounter within last 12 months    Recent Outpatient Visits           3 weeks ago Acute intractable headache, unspecified headache type   [facility] [place], Coralie Keens, NP   6 months ago Screening for colon cancer   [facility] [place], Coralie Keens, NP   9 months ago Other fatigue   [facility] [place], Coralie Keens, NP   1 year ago Encounter for general adult medical examination with abnormal findings   [facility], Coralie Keens, NP               rosuvastatin (CRESTOR) 10 MG tablet [Pharmacy Med Name: ROSUVASTATIN CALCIUM 10 MG TAB] 90 tablet 0    Sig: TAKE 1 TABLET BY MOUTH EVERY DAY     Cardiovascular:  Antilipid - Statins 2 Failed - 02/18/2022  8:26 AM      Failed - Lipid Panel in normal range within the last 12 months    Cholesterol  Date Value Ref Range Status  08/18/2021 224 (H) <200 mg/dL Final   LDL Cholesterol (Calc)  Date Value Ref Range Status  08/18/2021 154 (H) mg/dL (calc) Final    Comment:    Reference range: <100 . Desirable range <100 mg/dL for primary prevention;   <70 mg/dL for patients with CHD or diabetic patients  with > or = 2 CHD risk factors. Marland Kitchen LDL-C is now calculated using the Martin-Hopkins  calculation, which is a validated novel method providing  better accuracy than the Friedewald equation in the  estimation of LDL-C.  Cresenciano Genre et al. Annamaria Helling. 3267;124(58): 2061-2068  (http://education.[facility].com/faq/FAQ164)    HDL  Date Value Ref Range Status  08/18/2021 44 > OR = 40 mg/dL Final   Triglycerides  Date Value Ref Range Status  08/18/2021 137 <150 mg/dL Final         Passed - Cr in normal range and within 360 days    Creat  Date Value Ref Range Status  08/18/2021 0.98 0.70 - 1.30 mg/dL Final         Passed - Patient is not pregnant      Passed - Valid encounter within last 12 months    Recent Outpatient Visits           3 weeks ago Acute intractable headache, unspecified headache type   [facility] [place], Coralie Keens, NP   6 months ago Screening for colon cancer   [facility] [place], Coralie Keens, NP   9 months ago Other fatigue   [facility], Coralie Keens, NP   1 year ago Encounter for general adult medical examination with abnormal findings   [facility] [place], Coralie Keens, NP

## 2022-05-22 ENCOUNTER — Other Ambulatory Visit: Payer: Self-pay | Admitting: Internal Medicine

## 2022-05-23 NOTE — Telephone Encounter (Signed)
Requested Prescriptions  Pending Prescriptions Disp Refills   tamsulosin (FLOMAX) 0.4 MG CAPS capsule [Pharmacy Med Name: TAMSULOSIN HCL 0.4 MG CAPSULE] 90 capsule 0    Sig: TAKE 1 CAPSULE BY MOUTH EVERY DAY     Urology: Alpha-Adrenergic Blocker Passed - 05/22/2022  2:06 AM      Passed - PSA in normal range and within 360 days    PSA  Date Value Ref Range Status  08/18/2021 2.23 < OR = 4.00 ng/mL Final    Comment:    The total PSA value from this assay system is  standardized against the WHO standard. The test  result will be approximately 20% lower when compared  to the equimolar-standardized total PSA (Beckman  Coulter). Comparison of serial PSA results should be  interpreted with this fact in mind. . This test was performed using the Siemens  chemiluminescent method. Values obtained from  different assay methods cannot be used interchangeably. PSA levels, regardless of value, should not be interpreted as absolute evidence of the presence or absence of disease.          Passed - Last BP in normal range    BP Readings from Last 1 Encounters:  01/24/22 134/78         Passed - Valid encounter within last 12 months    Recent Outpatient Visits           3 months ago Acute intractable headache, unspecified headache type   [facility] [place], Salvadore [place], NP   9 months ago Screening for colon cancer   [place] [facility], Salvadore Oxford, NP   1 year ago Other fatigue   [place] [facility] [place], Salvadore Oxford, NP   1 year ago Encounter for general adult medical examination with abnormal findings   [place] [facility] [place], Salvadore Oxford, NP               rosuvastatin (CRESTOR) 10 MG tablet [Pharmacy Med Name: ROSUVASTATIN CALCIUM 10 MG TAB] 90 tablet 0    Sig: TAKE 1 TABLET BY MOUTH EVERY DAY     Cardiovascular:  Antilipid - Statins 2 Failed - 05/22/2022  2:06 AM      Failed - Lipid Panel in  normal range within the last 12 months    Cholesterol  Date Value Ref Range Status  08/18/2021 224 (H) <200 mg/dL Final   LDL Cholesterol (Calc)  Date Value Ref Range Status  08/18/2021 154 (H) mg/dL (calc) Final    Comment:    Reference range: <100 . Desirable range <100 mg/dL for primary prevention;   <70 mg/dL for patients with CHD or diabetic patients  with > or = 2 CHD risk factors. Marland Kitchen LDL-C is now calculated using the Martin-Hopkins  calculation, which is a validated novel method providing  better accuracy than the Friedewald equation in the  estimation of LDL-C.  Horald Pollen et al. Lenox Ahr. 1610;960(45): 2061-2068  (http://education.[facility].com/faq/FAQ164)    HDL  Date Value Ref Range Status  08/18/2021 44 > OR = 40 mg/dL Final   Triglycerides  Date Value Ref Range Status  08/18/2021 137 <150 mg/dL Final         Passed - Cr in normal range and within 360 days    Creat  Date Value Ref Range Status  08/18/2021 0.98 0.70 - 1.30 mg/dL Final         Passed - Patient is not pregnant  Passed - Valid encounter within last 12 months    Recent Outpatient Visits           3 months ago Acute intractable headache, unspecified headache type   [place] [facility] [place], Salvadore Oxford, NP   9 months ago Screening for colon cancer   [place] [facility] [place], Salvadore Oxford, NP   1 year ago Other fatigue   [place] [facility] [place], Salvadore Oxford, NP   1 year ago Encounter for general adult medical examination with abnormal findings   [place] [facility] [place], Salvadore Oxford, NP

## 2022-08-31 ENCOUNTER — Other Ambulatory Visit: Payer: Self-pay | Admitting: Internal Medicine

## 2022-09-01 NOTE — Telephone Encounter (Signed)
Requested medication (s) are due for refill today: yes  Requested medication (s) are on the active medication list: yes  Last refill:  both were reordered 05/23/22 #90  Future visit scheduled: no   Notes to clinic:  overdue lab work    Requested Prescriptions  Pending Prescriptions Disp Refills   tamsulosin (FLOMAX) 0.4 MG CAPS capsule [Pharmacy Med Name: TAMSULOSIN HCL 0.4 MG CAPSULE] 90 capsule 0    Sig: TAKE 1 CAPSULE BY MOUTH EVERY DAY     Urology: Alpha-Adrenergic Blocker Failed - 08/31/2022  2:41 AM      Failed - PSA in normal range and within 360 days    PSA  Date Value Ref Range Status  08/18/2021 2.23 < OR = 4.00 ng/mL Final    Comment:    The total PSA value from this assay system is  standardized against the WHO standard. The test  result will be approximately 20% lower when compared  to the equimolar-standardized total PSA (Beckman  Coulter). Comparison of serial PSA results should be  interpreted with this fact in mind. . This test was performed using the Siemens  chemiluminescent method. Values obtained from  different assay methods cannot be used interchangeably. PSA levels, regardless of value, should not be interpreted as absolute evidence of the presence or absence of disease.          Passed - Last BP in normal range    BP Readings from Last 1 Encounters:  01/24/22 134/78         Passed - Valid encounter within last 12 months    Recent Outpatient Visits           7 months ago Acute intractable headache, unspecified headache type   [place] [facility] [place], Salvadore Oxford, NP   1 year ago Screening for colon cancer   [place] [facility] [place], Salvadore Oxford, NP   1 year ago Other fatigue   [place] [facility] [place], [place], NP   2 years ago Encounter for general adult medical examination with abnormal findings    [facility] [place], Salvadore Oxford, NP                rosuvastatin (CRESTOR) 10 MG tablet [Pharmacy Med Name: ROSUVASTATIN CALCIUM 10 MG TAB] 90 tablet 0    Sig: TAKE 1 TABLET BY MOUTH EVERY DAY     Cardiovascular:  Antilipid - Statins 2 Failed - 08/31/2022  2:41 AM      Failed - Cr in normal range and within 360 days    Creat  Date Value Ref Range Status  08/18/2021 0.98 0.70 - 1.30 mg/dL Final         Failed - Lipid Panel in normal range within the last 12 months    Cholesterol  Date Value Ref Range Status  08/18/2021 224 (H) <200 mg/dL Final   LDL Cholesterol (Calc)  Date Value Ref Range Status  08/18/2021 154 (H) mg/dL (calc) Final    Comment:    Reference range: <100 . Desirable range <100 mg/dL for primary prevention;   <70 mg/dL for patients with CHD or diabetic patients  with > or = 2 CHD risk factors. Marland Kitchen LDL-C is now calculated using the Martin-Hopkins  calculation, which is a validated novel method providing  better accuracy than the Friedewald equation in the  estimation of LDL-C.  Horald Pollen et al. Lenox Ahr. 2956;213(08): 2061-2068  (http://education.[facility].com/faq/FAQ164)  HDL  Date Value Ref Range Status  08/18/2021 44 > OR = 40 mg/dL Final   Triglycerides  Date Value Ref Range Status  08/18/2021 137 <150 mg/dL Final         Passed - Patient is not pregnant      Passed - Valid encounter within last 12 months    Recent Outpatient Visits           7 months ago Acute intractable headache, unspecified headache type   [place] [facility] [place], Salvadore Oxford, NP   1 year ago Screening for colon cancer   [place] [facility] [place], Salvadore Oxford, NP   1 year ago Other fatigue   [place] [facility] [place], Salvadore Oxford, NP   2 years ago Encounter for general adult medical examination with abnormal findings    [facility] [place], Salvadore Oxford, NP

## 2022-09-22 ENCOUNTER — Other Ambulatory Visit (HOSPITAL_COMMUNITY): Payer: Self-pay | Admitting: Orthopedic Surgery

## 2022-09-22 ENCOUNTER — Encounter (HOSPITAL_BASED_OUTPATIENT_CLINIC_OR_DEPARTMENT_OTHER): Payer: Self-pay | Admitting: Orthopedic Surgery

## 2022-09-22 ENCOUNTER — Other Ambulatory Visit: Payer: Self-pay

## 2022-09-27 NOTE — H&P (Cosign Needed)
Joseph Salazar is an 52 y.o. male.   Chief Complaint: Right foot pain HPI: Patient is a 52 year old male who presents to the OR today for definitive treatment of his painful right fifth metatarsal zone 2 fracture.  Allergies:  Allergies  Allergen Reactions   Penicillins Anaphylaxis    Past Medical History:  Diagnosis Date   Herpes, genital    History of chicken pox    Hypercholesteremia     Past Surgical History:  Procedure Laterality Date   CARPAL TUNNEL WITH CUBITAL TUNNEL Left    COLONOSCOPY WITH PROPOFOL N/A 06/23/2019   Procedure: COLONOSCOPY WITH PROPOFOL;  Surgeon: [name], MD;  Location: [facility] ENDOSCOPY;  Service: Gastroenterology;  Laterality: N/A;   COLONOSCOPY WITH PROPOFOL N/A 09/22/2021   Procedure: COLONOSCOPY WITH PROPOFOL;  Surgeon: [name], MD;  Location: [facility] ENDOSCOPY;  Service: Gastroenterology;  Laterality: N/A;   VASECTOMY      Family History: Family History  Problem Relation Age of Onset   Lung cancer Mother    Diabetes Father    Hyperlipidemia Father    Heart disease Father    Heart disease Maternal Grandmother    Breast cancer Maternal Grandmother    Heart disease Paternal Grandmother    Diabetes Paternal Grandmother    Heart disease Paternal Grandfather     Social History:   reports that he has never smoked. He has never used smokeless tobacco. He reports current alcohol use. He reports that he does not use drugs.  Medications: No medications prior to admission.    No results found for this or any previous visit (from the past 48 hour(s)).  No results found.    Height 5\' 8"  (1.727 m), weight 97.5 kg.  PE:  well nourished and well developed.  NAD.  EOMI.  Resp unlabored.  Tender to palpation base of 5th metatarsal on the right.  Assessment/Plan Right 5th metatarsal fracture  Patient presents to the ER today for ORIF of right fifth metatarsal base fracture.  After reviewing the procedure, postoperative protocol, and  risks of surgery the patient has elected for surgical intervention.  The patient specifically understands risks of bleeding, infection, nerve damage, blood clots, need for additional surgery, continued pain, nonunion, post traumatic arthritis, recurrence of deformity, amputation and death.  [name] PA-C [facility] Office:  [phone]   No changes to note above.  To the OR today for ORIF R 5th MT fracture.  The risks and benefits of the alternative treatment options have been discussed in detail.  The patient wishes to proceed with surgery and specifically understands risks of bleeding, infection, nerve damage, blood clots, need for additional surgery, amputation and death.

## 2022-09-27 NOTE — Anesthesia Preprocedure Evaluation (Signed)
Anesthesia Evaluation  Patient identified by MRN, date of birth, ID band Patient awake    Reviewed: Allergy & Precautions, H&P , NPO status , Patient's Chart, lab work & pertinent test results  History of Anesthesia Complications Negative for: history of anesthetic complications  Airway Mallampati: II  TM Distance: >3 FB Neck ROM: Full    Dental no notable dental hx.    Pulmonary neg pulmonary ROS   Pulmonary exam normal breath sounds clear to auscultation       Cardiovascular Exercise Tolerance: Good negative cardio ROS Normal cardiovascular exam Rhythm:Regular Rate:Normal     Neuro/Psych negative neurological ROS  negative psych ROS   GI/Hepatic negative GI ROS, Neg liver ROS,,,  Endo/Other  negative endocrine ROS    Renal/GU negative Renal ROS     Musculoskeletal   Abdominal  (+) + obese  Peds  Hematology negative hematology ROS (+)   Anesthesia Other Findings Past Medical History: No date: Herpes, genital No date: History of chicken pox No date: Hypercholesteremia Past Surgical History: No date: VASECTOMY BMI    Body Mass Index: 31.17 kg/m     Reproductive/Obstetrics                             Anesthesia Physical Anesthesia Plan  ASA: 2  Anesthesia Plan: General   Post-op Pain Management: Tylenol PO (pre-op)*, Regional block* and Celebrex PO (pre-op)*   Induction: Intravenous  PONV Risk Score and Plan: 3 and Ondansetron, Dexamethasone, Treatment may vary due to age or medical condition and Midazolam  Airway Management Planned: LMA  Additional Equipment: None  Intra-op Plan:   Post-operative Plan: Extubation in OR  Informed Consent: I have reviewed the patients History and Physical, chart, labs and discussed the procedure including the risks, benefits and alternatives for the proposed anesthesia with the patient or authorized representative who has indicated  his/her understanding and acceptance.     Dental advisory given  Plan Discussed with: CRNA  Anesthesia Plan Comments:         Anesthesia Quick Evaluation

## 2022-09-28 ENCOUNTER — Ambulatory Visit (HOSPITAL_BASED_OUTPATIENT_CLINIC_OR_DEPARTMENT_OTHER): Payer: Worker's Compensation

## 2022-09-28 ENCOUNTER — Other Ambulatory Visit: Payer: Self-pay

## 2022-09-28 ENCOUNTER — Encounter (HOSPITAL_BASED_OUTPATIENT_CLINIC_OR_DEPARTMENT_OTHER)
Admission: RE | Disposition: A | Discharge status: Home / Self Care | Payer: Self-pay | Source: Home / Self Care | Attending: Orthopedic Surgery

## 2022-09-28 ENCOUNTER — Encounter (HOSPITAL_BASED_OUTPATIENT_CLINIC_OR_DEPARTMENT_OTHER): Payer: Self-pay | Admitting: Orthopedic Surgery

## 2022-09-28 ENCOUNTER — Ambulatory Visit (HOSPITAL_BASED_OUTPATIENT_CLINIC_OR_DEPARTMENT_OTHER)
Admission: RE | Admit: 2022-09-28 | Discharge: 2022-09-28 | Disposition: A | Discharge status: Home / Self Care | Payer: Worker's Compensation | Attending: Orthopedic Surgery | Admitting: Orthopedic Surgery

## 2022-09-28 ENCOUNTER — Ambulatory Visit (HOSPITAL_BASED_OUTPATIENT_CLINIC_OR_DEPARTMENT_OTHER): Payer: Worker's Compensation | Admitting: Anesthesiology

## 2022-09-28 DIAGNOSIS — S99101A Unspecified physeal fracture of right metatarsal, initial encounter for closed fracture: Secondary | ICD-10-CM

## 2022-09-28 DIAGNOSIS — E669 Obesity, unspecified: Secondary | ICD-10-CM | POA: Insufficient documentation

## 2022-09-28 DIAGNOSIS — E7800 Pure hypercholesterolemia, unspecified: Secondary | ICD-10-CM | POA: Diagnosis not present

## 2022-09-28 DIAGNOSIS — Z6833 Body mass index (BMI) 33.0-33.9, adult: Secondary | ICD-10-CM | POA: Insufficient documentation

## 2022-09-28 DIAGNOSIS — S92351A Displaced fracture of fifth metatarsal bone, right foot, initial encounter for closed fracture: Secondary | ICD-10-CM | POA: Insufficient documentation

## 2022-09-28 DIAGNOSIS — Y9389 Activity, other specified: Secondary | ICD-10-CM | POA: Insufficient documentation

## 2022-09-28 DIAGNOSIS — W108XXA Fall (on) (from) other stairs and steps, initial encounter: Secondary | ICD-10-CM | POA: Insufficient documentation

## 2022-09-28 HISTORY — PX: ORIF TOE FRACTURE: SHX5032

## 2022-09-28 SURGERY — OPEN REDUCTION INTERNAL FIXATION (ORIF) METATARSAL (TOE) FRACTURE
Anesthesia: General | Site: Toe | Laterality: Right

## 2022-09-28 MED ORDER — ACETAMINOPHEN 500 MG PO TABS
1000.0000 mg | ORAL_TABLET | Freq: Once | ORAL | Status: AC
  Administered 2022-09-28: 1000 mg via ORAL

## 2022-09-28 MED ORDER — CELECOXIB 200 MG PO CAPS
ORAL_CAPSULE | ORAL | Status: AC
  Filled 2022-09-28: qty 1

## 2022-09-28 MED ORDER — SODIUM CHLORIDE 0.9 % IV SOLN: INTRAVENOUS | Status: DC

## 2022-09-28 MED ORDER — DOCUSATE SODIUM 100 MG PO CAPS: 100.0000 mg | ORAL_CAPSULE | Freq: Two times a day (BID) | ORAL | 0 refills | Status: DC

## 2022-09-28 MED ORDER — DROPERIDOL 2.5 MG/ML IJ SOLN: 0.6250 mg | Freq: Once | INTRAMUSCULAR | Status: DC | PRN

## 2022-09-28 MED ORDER — VANCOMYCIN HCL 500 MG IV SOLR
INTRAVENOUS | Status: AC
  Filled 2022-09-28: qty 10

## 2022-09-28 MED ORDER — SENNA 8.6 MG PO TABS: 2.0000 | ORAL_TABLET | Freq: Two times a day (BID) | ORAL | 0 refills | Status: DC

## 2022-09-28 MED ORDER — ONDANSETRON HCL 4 MG/2ML IJ SOLN
INTRAMUSCULAR | Status: AC
  Filled 2022-09-28: qty 2

## 2022-09-28 MED ORDER — OXYCODONE HCL 5 MG PO TABS: 5.0000 mg | ORAL_TABLET | Freq: Once | ORAL | Status: DC | PRN

## 2022-09-28 MED ORDER — CEFAZOLIN SODIUM-DEXTROSE 2-4 GM/100ML-% IV SOLN
INTRAVENOUS | Status: AC
  Filled 2022-09-28: qty 100

## 2022-09-28 MED ORDER — BUPIVACAINE HCL (PF) 0.5 % IJ SOLN
INTRAMUSCULAR | Status: DC | PRN
Start: 2022-09-28 — End: 2022-09-28
  Administered 2022-09-28: 20 mL via PERINEURAL

## 2022-09-28 MED ORDER — PROPOFOL 500 MG/50ML IV EMUL
INTRAVENOUS | Status: AC
  Filled 2022-09-28: qty 50

## 2022-09-28 MED ORDER — DEXAMETHASONE SODIUM PHOSPHATE 10 MG/ML IJ SOLN
INTRAMUSCULAR | Status: AC
  Filled 2022-09-28: qty 1

## 2022-09-28 MED ORDER — ACETAMINOPHEN 500 MG PO TABS
ORAL_TABLET | ORAL | Status: AC
  Filled 2022-09-28: qty 2

## 2022-09-28 MED ORDER — CELECOXIB 200 MG PO CAPS
200.0000 mg | ORAL_CAPSULE | Freq: Once | ORAL | Status: AC
  Administered 2022-09-28: 200 mg via ORAL

## 2022-09-28 MED ORDER — BUPIVACAINE LIPOSOME 1.3 % IJ SUSP
INTRAMUSCULAR | Status: DC | PRN
Start: 2022-09-28 — End: 2022-09-28
  Administered 2022-09-28: 10 mL via PERINEURAL

## 2022-09-28 MED ORDER — MEPERIDINE HCL 25 MG/ML IJ SOLN: 6.2500 mg | INTRAMUSCULAR | Status: DC | PRN

## 2022-09-28 MED ORDER — FENTANYL CITRATE (PF) 100 MCG/2ML IJ SOLN
INTRAMUSCULAR | Status: AC
  Filled 2022-09-28: qty 2

## 2022-09-28 MED ORDER — LIDOCAINE HCL (CARDIAC) PF 100 MG/5ML IV SOSY
PREFILLED_SYRINGE | INTRAVENOUS | Status: DC | PRN
  Administered 2022-09-28: 100 mg via INTRAVENOUS

## 2022-09-28 MED ORDER — DEXAMETHASONE SODIUM PHOSPHATE 10 MG/ML IJ SOLN
INTRAMUSCULAR | Status: DC | PRN
Start: 2022-09-28 — End: 2022-09-28
  Administered 2022-09-28: 10 mg via INTRAVENOUS

## 2022-09-28 MED ORDER — BUPIVACAINE-EPINEPHRINE (PF) 0.5% -1:200000 IJ SOLN
INTRAMUSCULAR | Status: AC
  Filled 2022-09-28: qty 30

## 2022-09-28 MED ORDER — LIDOCAINE 2% (20 MG/ML) 5 ML SYRINGE
INTRAMUSCULAR | Status: AC
  Filled 2022-09-28: qty 5

## 2022-09-28 MED ORDER — OXYCODONE HCL 5 MG PO TABS
5.0000 mg | ORAL_TABLET | Freq: Four times a day (QID) | ORAL | 0 refills | Status: AC | PRN
Start: 2022-09-28 — End: 2022-10-01

## 2022-09-28 MED ORDER — PROMETHAZINE HCL 25 MG/ML IJ SOLN: 6.2500 mg | INTRAMUSCULAR | Status: DC | PRN

## 2022-09-28 MED ORDER — MIDAZOLAM HCL 2 MG/2ML IJ SOLN
INTRAMUSCULAR | Status: AC
  Filled 2022-09-28: qty 2

## 2022-09-28 MED ORDER — OXYCODONE HCL 5 MG/5ML PO SOLN: 5.0000 mg | Freq: Once | ORAL | Status: DC | PRN

## 2022-09-28 MED ORDER — CEFAZOLIN SODIUM-DEXTROSE 2-4 GM/100ML-% IV SOLN
2.0000 g | INTRAVENOUS | Status: AC
  Administered 2022-09-28: 2 g via INTRAVENOUS

## 2022-09-28 MED ORDER — LACTATED RINGERS IV SOLN: INTRAVENOUS | Status: DC

## 2022-09-28 MED ORDER — 0.9 % SODIUM CHLORIDE (POUR BTL) OPTIME
TOPICAL | Status: DC | PRN
  Administered 2022-09-28: 1000 mL

## 2022-09-28 MED ORDER — PROPOFOL 10 MG/ML IV BOLUS
INTRAVENOUS | Status: DC | PRN
  Administered 2022-09-28: 200 mg via INTRAVENOUS

## 2022-09-28 MED ORDER — FENTANYL CITRATE (PF) 100 MCG/2ML IJ SOLN: 25.0000 ug | INTRAMUSCULAR | Status: DC | PRN

## 2022-09-28 SURGICAL SUPPLY — 69 items
APL PRP STRL LF DISP 70% ISPRP (MISCELLANEOUS) ×1
BANDAGE ESMARK 6X9 LF (GAUZE/BANDAGES/DRESSINGS) IMPLANT
BIT DRILL CANNULATED 3.8MM (DRILL) IMPLANT
BLADE SURG 15 STRL LF DISP TIS (BLADE) ×2 IMPLANT
BLADE SURG 15 STRL SS (BLADE) ×2
BNDG CMPR 5X4 KNIT ELC UNQ LF (GAUZE/BANDAGES/DRESSINGS) ×1
BNDG CMPR 6 X 5 YARDS HK CLSR (GAUZE/BANDAGES/DRESSINGS)
BNDG CMPR 75X21 PLY HI ABS (MISCELLANEOUS) ×1
BNDG CMPR 9X4 STRL LF SNTH (GAUZE/BANDAGES/DRESSINGS) ×1
BNDG CMPR 9X6 STRL LF SNTH (GAUZE/BANDAGES/DRESSINGS)
BNDG ELASTIC 4INX 5YD STR LF (GAUZE/BANDAGES/DRESSINGS) ×1 IMPLANT
BNDG ELASTIC 6INX 5YD STR LF (GAUZE/BANDAGES/DRESSINGS) IMPLANT
BNDG ESMARK 4X9 LF (GAUZE/BANDAGES/DRESSINGS) IMPLANT
BNDG ESMARK 6X9 LF (GAUZE/BANDAGES/DRESSINGS)
BOOT STEPPER DURA LG (SOFTGOODS) IMPLANT
BOOT STEPPER DURA MED (SOFTGOODS) IMPLANT
CANISTER SUCT 1200ML W/VALVE (MISCELLANEOUS) ×1 IMPLANT
CHLORAPREP W/TINT 26 (MISCELLANEOUS) ×1 IMPLANT
COVER BACK TABLE 60X90IN (DRAPES) ×1 IMPLANT
CUFF TOURN SGL QUICK 34 (TOURNIQUET CUFF)
CUFF TRNQT CYL 34X4.125X (TOURNIQUET CUFF) IMPLANT
DRAPE EXTREMITY T 121X128X90 (DISPOSABLE) ×1 IMPLANT
DRAPE OEC MINIVIEW 54X84 (DRAPES) ×1 IMPLANT
DRAPE U-SHAPE 47X51 STRL (DRAPES) ×1 IMPLANT
DRILL CANNULATED 3.8MM (DRILL) ×1
DRSG MEPITEL 4X7.2 (GAUZE/BANDAGES/DRESSINGS) ×1 IMPLANT
ELECT REM PT RETURN 9FT ADLT (ELECTROSURGICAL) ×1
ELECTRODE REM PT RTRN 9FT ADLT (ELECTROSURGICAL) ×1 IMPLANT
GAUZE PAD ABD 8X10 STRL (GAUZE/BANDAGES/DRESSINGS) ×2 IMPLANT
GAUZE SPONGE 4X4 12PLY STRL (GAUZE/BANDAGES/DRESSINGS) ×1 IMPLANT
GAUZE STRETCH 2X75IN STRL (MISCELLANEOUS) ×1 IMPLANT
GLOVE BIO SURGEON STRL SZ8 (GLOVE) ×1 IMPLANT
GLOVE BIOGEL PI IND STRL 8 (GLOVE) ×2 IMPLANT
GLOVE ECLIPSE 8.0 STRL XLNG CF (GLOVE) ×1 IMPLANT
GOWN STRL REUS W/ TWL LRG LVL3 (GOWN DISPOSABLE) ×1 IMPLANT
GOWN STRL REUS W/ TWL XL LVL3 (GOWN DISPOSABLE) ×2 IMPLANT
GOWN STRL REUS W/TWL LRG LVL3 (GOWN DISPOSABLE) ×1
GOWN STRL REUS W/TWL XL LVL3 (GOWN DISPOSABLE) ×2
NDL HYPO 22X1.5 SAFETY MO (MISCELLANEOUS) IMPLANT
NEEDLE HYPO 22X1.5 SAFETY MO (MISCELLANEOUS)
NS IRRIG 1000ML POUR BTL (IV SOLUTION) ×1 IMPLANT
PACK BASIN DAY SURGERY FS (CUSTOM PROCEDURE TRAY) ×1 IMPLANT
PAD CAST 4YDX4 CTTN HI CHSV (CAST SUPPLIES) ×1 IMPLANT
PADDING CAST ABS COTTON 4X4 ST (CAST SUPPLIES) IMPLANT
PADDING CAST COTTON 4X4 STRL (CAST SUPPLIES) ×1
PADDING CAST COTTON 6X4 STRL (CAST SUPPLIES) IMPLANT
PENCIL SMOKE EVACUATOR (MISCELLANEOUS) ×1 IMPLANT
SANITIZER HAND PURELL FF 515ML (MISCELLANEOUS) ×1 IMPLANT
SCREW SOLID MONSTER BT 5.5X40 (Screw) IMPLANT
SHEET MEDIUM DRAPE 40X70 STRL (DRAPES) ×1 IMPLANT
SLEEVE SCD COMPRESS KNEE MED (STOCKING) ×1 IMPLANT
SPIKE FLUID TRANSFER (MISCELLANEOUS) IMPLANT
SPONGE T-LAP 18X18 ~~LOC~~+RFID (SPONGE) ×1 IMPLANT
STOCKINETTE 6 STRL (DRAPES) ×1 IMPLANT
SUCTION TUBE FRAZIER 10FR DISP (SUCTIONS) ×1 IMPLANT
SUT ETHILON 3 0 PS 1 (SUTURE) ×1 IMPLANT
SUT FIBERWIRE #2 38 T-5 BLUE (SUTURE)
SUT MNCRL AB 3-0 PS2 18 (SUTURE) IMPLANT
SUT VIC AB 2-0 SH 27 (SUTURE)
SUT VIC AB 2-0 SH 27XBRD (SUTURE) IMPLANT
SUT VICRYL 0 SH 27 (SUTURE) IMPLANT
SUTURE FIBERWR #2 38 T-5 BLUE (SUTURE) IMPLANT
SYR BULB EAR ULCER 3OZ GRN STR (SYRINGE) ×1 IMPLANT
SYR CONTROL 10ML LL (SYRINGE) IMPLANT
TAP 5.5 (TAP) IMPLANT
TOWEL GREEN STERILE FF (TOWEL DISPOSABLE) ×2 IMPLANT
TUBE CONNECTING 20X1/4 (TUBING) ×1 IMPLANT
UNDERPAD 30X36 HEAVY ABSORB (UNDERPADS AND DIAPERS) ×1 IMPLANT
WIRE SMOOTH NITINOL 1.6X200 (WIRE) IMPLANT

## 2022-09-28 NOTE — Transfer of Care (Signed)
Immediate Anesthesia Transfer of Care Note  Patient: Luisdavid Bouwman [facility]  Procedure(s) Performed: OPEN REDUCTION INTERNAL FIXATION (ORIF) RIGHT 5th METATARSAL (TOE) FRACTURE (Right: Toe)  Patient Location: PACU  Anesthesia Type:GA combined with regional for post-op pain  Level of Consciousness: drowsy, patient cooperative, and responds to stimulation  Airway & Oxygen Therapy: Patient Spontanous Breathing and Patient connected to face mask oxygen  Post-op Assessment: Report given to RN and Post -op Vital signs reviewed and stable  Post vital signs: Reviewed and stable  Last Vitals:  Vitals Value Taken Time  BP    Temp    Pulse    Resp    SpO2      Last Pain:  Vitals:   09/28/22 0629  TempSrc: Oral  PainSc: 0-No pain         Complications: No notable events documented.

## 2022-09-28 NOTE — Discharge Instructions (Addendum)
Post Anesthesia Home Care Instructions  Activity: Get plenty of rest for the remainder of the day. A responsible individual must stay with you for 24 hours following the procedure.  For the next 24 hours, DO NOT: -Drive a car -Advertising copywriter -Drink alcoholic beverages -Take any medication unless instructed by your physician -Make any legal decisions or sign important papers.  Meals: Start with liquid foods such as gelatin or soup. Progress to regular foods as tolerated. Avoid greasy, spicy, heavy foods. If nausea and/or vomiting occur, drink only clear liquids until the nausea and/or vomiting subsides. Call your physician if vomiting continues.  Special Instructions/Symptoms: Your throat may feel dry or sore from the anesthesia or the breathing tube placed in your throat during surgery. If this causes discomfort, gargle with warm salt water. The discomfort should disappear within 24 hours.  If you had a scopolamine patch placed behind your ear for the management of post- operative nausea and/or vomiting:  1. The medication in the patch is effective for 72 hours, after which it should be removed.  Wrap patch in a tissue and discard in the trash. Wash hands thoroughly with soap and water. 2. You may remove the patch earlier than 72 hours if you experience unpleasant side effects which may include dry mouth, dizziness or visual disturbances. 3. Avoid touching the patch. Wash your hands with soap and water after contact with the patch.      Regional Anesthesia Blocks  1. You may not be able to move or feel the "blocked" extremity after a regional anesthetic block. This may last may last from 3-48 hours after placement, but it will go away. The length of time depends on the medication injected and your individual response to the medication. As the nerves start to wake up, you may experience tingling as the movement and feeling returns to your extremity. If the numbness and inability to  move your extremity has not gone away after 48 hours, please call your surgeon.   2. The extremity that is blocked will need to be protected until the numbness is gone and the strength has returned. Because you cannot feel it, you will need to take extra care to avoid injury. Because it may be weak, you may have difficulty moving it or using it. You may not know what position it is in without looking at it while the block is in effect.  3. For blocks in the legs and feet, returning to weight bearing and walking needs to be done carefully. You will need to wait until the numbness is entirely gone and the strength has returned. You should be able to move your leg and foot normally before you try and bear weight or walk. You will need someone to be with you when you first try to ensure you do not fall and possibly risk injury.  4. Bruising and tenderness at the needle site are common side effects and will resolve in a few days.  5. Persistent numbness or new problems with movement should be communicated to the surgeon or the [facility] [phone] [facility] [phone]).   Toni Arthurs, MD    Next dose of tylenol may be taken 1pInformation for Discharge Teaching: EXPAREL (bupivacaine liposome injectable suspension)   Pain relief is important to your recovery. The goal is to control your pain so you can move easier and return to your normal activities as soon as possible after your procedure. Your physician may use several types  of medicines to manage pain, swelling, and more.  Your surgeon or anesthesiologist gave you EXPAREL(bupivacaine) to help control your pain after surgery.  EXPAREL is a local anesthetic designed to release slowly over an extended period of time to provide pain relief by numbing the tissue around the surgical site. EXPAREL is designed to release pain medication over time and can control pain for up to 72 hours. Depending on how you respond  to EXPAREL, you may require less pain medication during your recovery. EXPAREL can help reduce or eliminate the need for opioids during the first few days after surgery when pain relief is needed the most. EXPAREL is not an opioid and is not addictive. It does not cause sleepiness or sedation.   Important! A teal colored band has been placed on your arm with the date, time and amount of EXPAREL you have received. Please leave this armband in place for the full 96 hours following administration, and then you may remove the band. If you return to the hospital for any reason within 96 hours following the administration of EXPAREL, the armband provides important information that your health care providers to know, and alerts them that you have received this anesthetic.    Possible side effects of EXPAREL: Temporary loss of sensation or ability to move in the area where medication was injected. Nausea, vomiting, constipation Rarely, numbness and tingling in your mouth or lips, lightheadedness, or anxiety may occur. Call your doctor right away if you think you may be experiencing any of these sensations, or if you have other questions regarding possible side effects.  Follow all other discharge instructions given to you by your surgeon or nurse. Eat a healthy diet and drink plenty of water or other fluids.      [facility]  Please read the following information regarding your care after surgery.  Medications  You only need a prescription for the narcotic pain medicine (ex. oxycodone, Percocet, Norco).  All of the other medicines listed below are available over the counter. ? Aleve 2 pills twice a day for the first 3 days after surgery. ? acetominophen (Tylenol) 650 mg every 4-6 hours as you need for minor to moderate pain ? oxycodone as prescribed for severe pain  Narcotic pain medicine (ex. oxycodone, Percocet, Vicodin) will cause constipation.  To prevent this problem, take the following  medicines while you are taking any pain medicine. ? docusate sodium (Colace) 100 mg twice a day ? senna (Senokot) 2 tablets twice a day   Weight Bearing ? Bear weight only on your operated foot, on your heel, in the CAM boot.   Cast / Splint / Dressing ? Keep your splint, cast or dressing clean and dry.  Don't put anything (coat hanger, pencil, etc) down inside of it.  If it gets damp, use a hair dryer on the cool setting to dry it.  If it gets soaked, call the office to schedule an appointment for a cast change.   After your dressing, cast or splint is removed; you may shower, but do not soak or scrub the wound.  Allow the water to run over it, and then gently pat it dry.  Swelling It is normal for you to have swelling where you had surgery.  To reduce swelling and pain, keep your toes above your nose for at least 3 days after surgery.  It may be necessary to keep your foot or leg elevated for several weeks.  If it hurts, it should be elevated.  Follow Up Call my office at [phone] when you are discharged from the hospital or surgery center to schedule an appointment to be seen two weeks after surgery.  Call my office at [phone] if you develop a fever >101.5 F, nausea, vomiting, bleeding from the surgical site or severe pain.

## 2022-09-28 NOTE — Anesthesia Procedure Notes (Signed)
Anesthesia Regional Block: Popliteal block   Pre-Anesthetic Checklist: , timeout performed,  Correct Patient, Correct Site, Correct Laterality,  Correct Procedure, Correct Position, site marked,  Risks and benefits discussed,  Surgical consent,  Pre-op evaluation,  At surgeon's request and post-op pain management  Laterality: Lower and Right  Prep: chloraprep       Needles:  Injection technique: Single-shot  Needle Type: Stimiplex     Needle Length: 10cm  Needle Gauge: 21     Additional Needles:   Procedures:,,,, ultrasound used (permanent image in chart),,   Motor weakness within 5 minutes.  Narrative:  Start time: 09/28/2022 6:59 AM End time: 09/28/2022 7:19 AM Injection made incrementally with aspirations every 5 mL.  Performed by: Personally  Anesthesiologist: Lewie Loron, MD  Additional Notes: Nerve located and needle positioned with direct ultrasound guidance. Good perineural spread. Patient tolerated well.

## 2022-09-28 NOTE — Progress Notes (Signed)
Assisted Dr. Lissa Hoard with right, popliteal block. Side rails up, monitors on throughout procedure. See vital signs in flow sheet. Tolerated Procedure well.

## 2022-09-28 NOTE — Anesthesia Procedure Notes (Signed)
Procedure Name: LMA Insertion Date/Time: 09/28/2022 7:37 AM  Performed by: Thornell Mule, CRNAPre-anesthesia Checklist: Patient identified, Emergency Drugs available, Suction available and Patient being monitored Patient Re-evaluated:Patient Re-evaluated prior to induction Oxygen Delivery Method: Circle system utilized Preoxygenation: Pre-oxygenation with 100% oxygen Induction Type: IV induction LMA: LMA inserted LMA Size: 4.0 Number of attempts: 1 Placement Confirmation: positive ETCO2 Tube secured with: Tape Dental Injury: Teeth and Oropharynx as per pre-operative assessment

## 2022-09-28 NOTE — Anesthesia Postprocedure Evaluation (Signed)
Anesthesia Post Note  Patient: Joseph Salazar [facility]  Procedure(s) Performed: OPEN REDUCTION INTERNAL FIXATION (ORIF) RIGHT 5th METATARSAL (TOE) FRACTURE (Right: Toe)     Patient location during evaluation: PACU Anesthesia Type: General Level of consciousness: sedated and patient cooperative Pain management: pain level controlled Vital Signs Assessment: post-procedure vital signs reviewed and stable Respiratory status: spontaneous breathing Cardiovascular status: stable Anesthetic complications: no   No notable events documented.  Last Vitals:  Vitals:   09/28/22 0845 09/28/22 0900  BP: (!) 129/92 (!) 129/92  Pulse: 80 82  Resp: 13 15  Temp:  (!) 36.2 C  SpO2: 96% 94%    Last Pain:  Vitals:   09/28/22 0845  TempSrc:   PainSc: 0-No pain                 Lewie Loron

## 2022-09-28 NOTE — Op Note (Signed)
09/28/2022  8:12 AM  PATIENT:  Joseph Salazar  52 y.o. male  PRE-OPERATIVE DIAGNOSIS:  right 5th metatarsal displaced zone 2 fracture  POST-OPERATIVE DIAGNOSIS:  same  Procedure(s):  1.  Open treatment right fifth metatarsal fracture with internal fixation 2.  AP, oblique and lateral radiographs of the right foot  SURGEON:  Toni Arthurs, MD  ASSISTANT: [name], PA-C  ANESTHESIA:   General, regional  EBL:  minimal   TOURNIQUET:  * No tourniquets in log *  COMPLICATIONS:  None apparent  DISPOSITION:  Extubated, awake and stable to recovery.  INDICATION FOR PROCEDURE: 52 year old male without significant past medical history injured his right foot when he slipped off a stepstool at work.  He has a displaced fifth metatarsal zone 2 fracture and presents today for operative treatment of this displaced and unstable right foot injury.  The risks and benefits of the alternative treatment options have been discussed in detail.  The patient wishes to proceed with surgery and specifically understands risks of bleeding, infection, nerve damage, blood clots, need for additional surgery, amputation and death.   PROCEDURE IN DETAIL:  After pre operative consent was obtained, and the correct operative site was identified, the patient was brought to the operating room and placed supine on the OR table.  Anesthesia was administered.  Pre-operative antibiotics were administered.  A surgical timeout was taken.  The right lower extremity was prepped and draped in standard sterile fashion.  An incision was made just proximal from the base of the fifth metatarsal.  Dissection was carried down through the subcutaneous tissues.  A guidepin for the 5.5 mm Paragon 28 screw was inserted at the base of the fifth metatarsal.  AP and lateral radiographs confirmed appropriate position of the guidepin in the medullary canal across the fracture site.  The guidepin was measured.  The base of the metatarsal was  countersunk.  The guidepin was overdrilled.  The fracture site was tapped.  The guidepin was removed.  A solid 5.5 mm partially-threaded screw was inserted.  It was noted to have excellent purchase and compressed the fracture site appropriately.  Final AP, oblique and lateral radiographs confirmed appropriate reduction of the fracture and appropriate position and length of the screw.  The wound was irrigated and closed with nylon.  Sterile dressings were applied followed by a cam boot.  The patient was awakened from anesthesia and transported to the recovery room in stable condition.  FOLLOW UP PLAN: Weightbearing as tolerated on the heel in a cam boot.  Follow-up in the office in 2 weeks for suture removal.  Plan 6 weeks postoperative immobilization.  RADIOGRAPHS: AP, oblique and lateral radiographs of the right foot are obtained intraoperatively.  These show interval reduction and fixation of the fifth metatarsal zone 2 fracture with an intramedullary implant.  No other acute injuries are noted.  The fracture is appropriately reduced and compressed.    [name] PA-C was present and scrubbed for the duration of the operative case. His assistance was essential in positioning the patient, prepping and draping, gaining and maintaining exposure, performing the operation, closing and dressing the wounds and applying the splint.

## 2022-09-29 ENCOUNTER — Encounter (HOSPITAL_BASED_OUTPATIENT_CLINIC_OR_DEPARTMENT_OTHER): Payer: Self-pay | Admitting: Orthopedic Surgery

## 2023-01-01 ENCOUNTER — Other Ambulatory Visit: Payer: Self-pay | Admitting: Internal Medicine

## 2023-01-01 MED ORDER — TAMSULOSIN HCL 0.4 MG PO CAPS: 0.4000 mg | ORAL_CAPSULE | Freq: Every day | ORAL | 0 refills | Status: DC

## 2023-01-01 MED ORDER — ROSUVASTATIN CALCIUM 10 MG PO TABS: 10.0000 mg | ORAL_TABLET | Freq: Every day | ORAL | 0 refills | Status: DC

## 2023-08-24 ENCOUNTER — Ambulatory Visit: Admitting: Internal Medicine

## 2023-08-24 ENCOUNTER — Encounter: Payer: Self-pay | Admitting: Internal Medicine

## 2023-08-24 VITALS — BP 128/84 | Ht 68.0 in | Wt 203.4 lb

## 2023-08-24 DIAGNOSIS — L255 Unspecified contact dermatitis due to plants, except food: Secondary | ICD-10-CM

## 2023-08-24 MED ORDER — PREDNISONE 10 MG PO TABS: ORAL_TABLET | ORAL | 0 refills | Status: DC

## 2023-08-24 MED ORDER — METHYLPREDNISOLONE ACETATE 80 MG/ML IJ SUSP
80.0000 mg | Freq: Once | INTRAMUSCULAR | Status: AC
Start: 2023-08-24 — End: 2023-08-24
  Administered 2023-08-24: 80 mg via INTRAMUSCULAR

## 2023-08-24 NOTE — Patient Instructions (Signed)
 Poison Ivy Dermatitis Poison ivy dermatitis is redness and soreness of the skin caused by chemicals in the leaves of the poison ivy plant. You may have very bad itching, swelling, a rash, and blisters. What are the causes? Touching a poison ivy plant. Touching something that has the chemical on it. This may include animals or objects that have come in contact with the plant. What increases the risk? Going outdoors often in wooded or [place] areas. Going outdoors without wearing protective clothing, such as closed shoes, long pants, and a long-sleeved shirt. What are the signs or symptoms?  Skin redness. Very bad itching. A rash that often includes bumps and blisters. The rash usually appears 48 hours after exposure, if you have had it before. If this is the first time you have it, the rash may not appear until a week after exposure. Swelling. This may occur if the reaction is very bad. Symptoms usually last for 1-2 weeks. The first time you get this condition, symptoms may last 3-4 weeks. How is this treated? This condition may be treated with: Hydrocortisone cream or calamine lotion to relieve itching. Oatmeal baths to soothe the skin. Medicines, such as over-the-counter antihistamine tablets. Oral steroid medicine for very bad reactions. Follow these instructions at home: Medicines Take or apply over-the-counter and prescription medicines only as told by your doctor. Use hydrocortisone cream or calamine lotion as needed to help with itching. General instructions Do not scratch or rub your skin. Put a cold, wet cloth (cold compress) on the affected areas or take baths in cool water. This will help with itching. Avoid hot baths and showers. Take oatmeal baths as needed. Use colloidal oatmeal. You can get this at a pharmacy or grocery store. Follow the instructions on the package. While you have the rash, wash your clothes right after you wear them. Check the affected area every day  for signs of infection. Check for: More redness, swelling, or pain. Fluid or blood. Warmth. Pus or a bad smell. Keep all follow-up visits. Your doctor may want to see how your skin is doing with treatment. How is this prevented?  Know what poison ivy looks like, so you can avoid it. This plant has three leaves with flowering branches on a single stem. The leaves are glossy. The leaves have uneven edges that come to a point. If you touch poison ivy, wash your skin with soap and water right away. Be sure to wash under your fingernails. When hiking or camping, wear long pants, a long-sleeved shirt, long socks, and hiking boots. You can also use a lotion on your skin that helps to prevent contact with poison ivy. If you think that your clothes or outdoor gear came in contact with poison ivy, rinse them off with a garden hose before you bring them inside your house. When doing yard work or gardening, wear gloves, long sleeves, long pants, and boots. Wash your garden tools and gloves if they come in contact with poison ivy. If you think that your pet has come into contact with poison ivy, wash them with pet shampoo and water. Make sure to wear gloves while washing your pet. Contact a doctor if: You have open sores in the rash area. You have any signs of infection. You have redness that spreads past the rash area. You have a fever. You have a rash over a large area of your body. You have a rash on your eyes, mouth, or genitals. Your rash does not get better after  a few weeks. Get help right away if: Your face swells or your eyes swell shut. You have trouble breathing. You have trouble swallowing. These symptoms may be an emergency. Do not wait to see if the symptoms will go away. Get help right away. Call 911. This information is not intended to replace advice given to you by your health care provider. Make sure you discuss any questions you have with your health care provider. Document  Revised: 06/02/2021 Document Reviewed: 06/02/2021 Elsevier Patient Education  2024 [employer].

## 2023-08-24 NOTE — Progress Notes (Signed)
 Subjective:    Patient ID: Joseph Salazar, male    DOB: 06/09/1970, 53 y.o.   MRN: [id number]  HPI  Discussed the use of AI scribe software for clinical note transcription with the patient, who gave verbal consent to proceed.  Joseph Salazar is a 53 year old male who presents with a rash due to poison ivy exposure.  Contact dermatitis - Rash developed after poison ivy exposure on Monday - Rash initially mistaken for ant bites - Rash worsened by Wednesday and Thursday - Rash more severe on legs - Rash interfered with ability to wear boots and socks for work  Symptom management - Epsom salt baths provided some relief - Cortisone cream applied, resulting in skin turning 'straight pink'       Review of Systems   Past Medical History:  Diagnosis Date   Herpes, genital    History of chicken pox    Hypercholesteremia     Current Outpatient Medications  Medication Sig Dispense Refill   Cyanocobalamin (VITAMIN B-12) 500 MCG SUBL Place 1 tablet under the tongue daily. 100 mcg     docusate sodium  (COLACE) 100 MG capsule Take 1 capsule (100 mg total) by mouth 2 (two) times daily. While taking narcotic pain medicine. 30 capsule 0   Multiple Vitamins-Minerals (MULTIVITAMIN ADULT PO) Take 1 tablet by mouth daily.     rosuvastatin  (CRESTOR ) 10 MG tablet Take 1 tablet (10 mg total) by mouth daily. 30 tablet 0   senna (SENOKOT) 8.6 MG TABS tablet Take 2 tablets (17.2 mg total) by mouth 2 (two) times daily. 30 tablet 0   tamsulosin  (FLOMAX ) 0.4 MG CAPS capsule Take 1 capsule (0.4 mg total) by mouth daily. 30 capsule 0   triamcinolone  cream (KENALOG ) 0.1 % APPLY TO AFFECTED AREA TWICE A DAY 80 g 1   No current facility-administered medications for this visit.    Allergies  Allergen Reactions   Penicillins Anaphylaxis    Family History  Problem Relation Age of Onset   Lung cancer Mother    Diabetes Father    Hyperlipidemia Father    Heart disease Father    Heart  disease Maternal Grandmother    Breast cancer Maternal Grandmother    Heart disease Paternal Grandmother    Diabetes Paternal Grandmother    Heart disease Paternal Grandfather     Social History   Socioeconomic History   Marital status: Divorced    Spouse name: Not on file   Number of children: Not on file   Years of education: Not on file   Highest education level: Not on file  Occupational History   Not on file  Tobacco Use   Smoking status: Never   Smokeless tobacco: Never  Vaping Use   Vaping status: Never Used  Substance and Sexual Activity   Alcohol use: Yes    Comment: occasional,none last 24 hrs   Drug use: No   Sexual activity: Not on file  Other Topics Concern   Not on file  Social History Narrative   Not on file   Social Drivers of Health   Financial Resource Strain: Not on file  Food Insecurity: Not on file  Transportation Needs: Not on file  Physical Activity: Not on file  Stress: Not on file  Social Connections: Not on file  Intimate Partner Violence: Not on file     Constitutional: Denies fever, malaise, fatigue, headache or abrupt weight changes.  Respiratory: Denies difficulty breathing, shortness of breath, cough  or sputum production.   Cardiovascular: Denies chest pain, chest tightness, palpitations or swelling in the hands or feet.  Skin: Patient reports rash of bilateral arms and legs.  Denies ulcercations.    No other specific complaints in a complete review of systems (except as listed in HPI above).      Objective:   Physical Exam BP 128/84 (BP Location: Right Arm, Patient Position: Sitting, Cuff Size: Normal)   Ht 5' 8 (1.727 m)   Wt 203 lb 6.4 oz (92.3 kg)   BMI 30.93 kg/m   Wt Readings from Last 3 Encounters:  09/28/22 221 lb 5.5 oz (100.4 kg)  01/24/22 208 lb (94.3 kg)  09/22/21 205 lb (93 kg)    General: Appears his stated age, obese, in NAD. Skin: Grouped, papular and vesicular lesions in linear pattern noted of  bilateral forearms and bilateral lower extremities. HEENT: Head: normal shape and size; Eyes: sclera white, no icterus, conjunctiva pink, PERRLA and EOMs intact;  Cardiovascular: Normal rate and rhythm.  Pulmonary/Chest: Normal effort and positive vesicular breath sounds. No respiratory distress.  Musculoskeletal: No difficulty with gait.  Neurological: Alert and oriented.   BMET    Component Value Date/Time   NA 139 08/18/2021 1526   K 4.3 08/18/2021 1526   CL 104 08/18/2021 1526   CO2 25 08/18/2021 1526   GLUCOSE 80 08/18/2021 1526   BUN 17 08/18/2021 1526   CREATININE 0.98 08/18/2021 1526   CALCIUM  9.4 08/18/2021 1526   GFRNONAA 82 07/20/2020 0812   GFRAA 95 07/20/2020 0812    Lipid Panel     Component Value Date/Time   CHOL 224 (H) 08/18/2021 1526   TRIG 137 08/18/2021 1526   HDL 44 08/18/2021 1526   CHOLHDL 5.1 (H) 08/18/2021 1526   VLDL 36.6 05/12/2019 1614   LDLCALC 154 (H) 08/18/2021 1526    CBC    Component Value Date/Time   WBC 6.8 08/18/2021 1526   RBC 5.34 08/18/2021 1526   HGB 16.5 08/18/2021 1526   HCT 48.1 08/18/2021 1526   PLT 209 08/18/2021 1526   MCV 90.1 08/18/2021 1526   MCH 30.9 08/18/2021 1526   MCHC 34.3 08/18/2021 1526   RDW 12.6 08/18/2021 1526    Hgb A1C Lab Results  Component Value Date   HGBA1C 5.2 07/20/2020            Assessment & Plan:   Assessment and Plan    Acute allergic contact dermatitis due to poison ivy exposure Acute dermatitis on legs, worsening midweek. Topical treatment impractical due to extent. - Administered Depo-Medrol  80 mg IM x 1 - Prescribed six-day prednisone  taper.  - Recommended Epsom salt baths, oatmeal baths, and Calamine lotion for relief. - Advised Benadryl up to 50 mg every eight hours if not too sedating.      Schedule an appointment for your annual exam Angeline Laura, NP

## 2023-10-22 ENCOUNTER — Encounter: Payer: Self-pay | Admitting: Internal Medicine

## 2023-10-22 ENCOUNTER — Ambulatory Visit: Admitting: Internal Medicine

## 2023-10-22 VITALS — BP 132/84 | Ht 68.0 in | Wt 205.8 lb

## 2023-10-22 DIAGNOSIS — Z23 Encounter for immunization: Secondary | ICD-10-CM

## 2023-10-22 DIAGNOSIS — R739 Hyperglycemia, unspecified: Secondary | ICD-10-CM

## 2023-10-22 DIAGNOSIS — Z125 Encounter for screening for malignant neoplasm of prostate: Secondary | ICD-10-CM

## 2023-10-22 DIAGNOSIS — E6609 Other obesity due to excess calories: Secondary | ICD-10-CM

## 2023-10-22 DIAGNOSIS — E7800 Pure hypercholesterolemia, unspecified: Secondary | ICD-10-CM

## 2023-10-22 DIAGNOSIS — E66811 Obesity, class 1: Secondary | ICD-10-CM

## 2023-10-22 DIAGNOSIS — Z0001 Encounter for general adult medical examination with abnormal findings: Secondary | ICD-10-CM

## 2023-10-22 DIAGNOSIS — Z6831 Body mass index (BMI) 31.0-31.9, adult: Secondary | ICD-10-CM

## 2023-10-22 LAB — COMPREHENSIVE METABOLIC PANEL WITH GFR
AG Ratio: 1.6 (calc) (ref 1.0–2.5)
ALT: 20 U/L (ref 9–46)
AST: 20 U/L (ref 10–35)
Albumin: 4.6 g/dL (ref 3.6–5.1)
Alkaline phosphatase (APISO): 67 U/L (ref 35–144)
BUN: 13 mg/dL (ref 7–25)
CO2: 28 mmol/L (ref 20–32)
Calcium: 9.3 mg/dL (ref 8.6–10.3)
Chloride: 104 mmol/L (ref 98–110)
Creat: 0.98 mg/dL (ref 0.70–1.30)
Globulin: 2.9 g/dL (ref 1.9–3.7)
Glucose, Bld: 80 mg/dL (ref 65–99)
Potassium: 4.3 mmol/L (ref 3.5–5.3)
Sodium: 139 mmol/L (ref 135–146)
Total Bilirubin: 0.6 mg/dL (ref 0.2–1.2)
Total Protein: 7.5 g/dL (ref 6.1–8.1)
eGFR: 92 mL/min/1.73m2 (ref >=60)

## 2023-10-22 LAB — LIPID PANEL
Cholesterol: 321 mg/dL — ABNORMAL HIGH (ref <200)
HDL: 47 mg/dL (ref >=40)
LDL Cholesterol (Calc): 245 mg/dL — ABNORMAL HIGH
Non-HDL Cholesterol (Calc): 274 mg/dL — ABNORMAL HIGH (ref <130)
Total CHOL/HDL Ratio: 6.8 (calc) — ABNORMAL HIGH (ref <5.0)
Triglycerides: 139 mg/dL (ref <150)

## 2023-10-22 LAB — CBC
HCT: 47.9 % (ref 38.5–50.0)
Hemoglobin: 16.2 g/dL (ref 13.2–17.1)
MCH: 30.8 pg (ref 27.0–33.0)
MCHC: 33.8 g/dL (ref 32.0–36.0)
MCV: 91.1 fL (ref 80.0–100.0)
MPV: 10.2 fL (ref 7.5–12.5)
Platelets: 242 Thousand/uL (ref 140–400)
RBC: 5.26 Million/uL (ref 4.20–5.80)
RDW: 13.2 % (ref 11.0–15.0)
WBC: 6 Thousand/uL (ref 3.8–10.8)

## 2023-10-22 LAB — HEMOGLOBIN A1C
Hgb A1c MFr Bld: 5.2 % (ref <5.7)
Mean Plasma Glucose: 103 mg/dL
eAG (mmol/L): 5.7 mmol/L

## 2023-10-22 LAB — PSA: PSA: 2.88 ng/mL (ref <=4.00)

## 2023-10-22 MED ORDER — TAMSULOSIN HCL 0.4 MG PO CAPS: 0.4000 mg | ORAL_CAPSULE | Freq: Every day | ORAL | 1 refills | Status: AC

## 2023-10-22 NOTE — Progress Notes (Signed)
 Subjective:    Patient ID: Joseph Salazar, male    DOB: 05/08/1970, 53 y.o.   MRN: [id number]  HPI  Patient presents to clinic today for his annual exam.  Flu: 02/2017 Tetanus: 03/2016 COVID: Pfizer x 2 Shingrix: 06/2020, 08/2020 PSA screening: 08/2021 Colon screening: 09/2021 Vision screening: annually Dentist: biannually  Diet: He does eat meat. He consumes fruits and veggies. He does eat some fried foods. He drinks mostly water and coffee. Exercise: Walking  Review of Systems     Past Medical History:  Diagnosis Date   Herpes, genital    History of chicken pox    Hypercholesteremia     Current Outpatient Medications  Medication Sig Dispense Refill   Multiple Vitamins-Minerals (MULTIVITAMIN ADULT PO) Take 1 tablet by mouth daily.     predniSONE  (DELTASONE ) 10 MG tablet Take 6 tabs on day 1, 5 tabs on day 2, 4 tabs on day 3, 3 tabs on day 4, 2 tabs on day 5, 1 tab on day 6 21 tablet 0   rosuvastatin  (CRESTOR ) 10 MG tablet Take 1 tablet (10 mg total) by mouth daily. 30 tablet 0   senna (SENOKOT) 8.6 MG TABS tablet Take 2 tablets (17.2 mg total) by mouth 2 (two) times daily. 30 tablet 0   tamsulosin  (FLOMAX ) 0.4 MG CAPS capsule Take 1 capsule (0.4 mg total) by mouth daily. 30 capsule 0   triamcinolone  cream (KENALOG ) 0.1 % APPLY TO AFFECTED AREA TWICE A DAY 80 g 1   No current facility-administered medications for this visit.    Allergies  Allergen Reactions   Penicillins Anaphylaxis    Family History  Problem Relation Age of Onset   Lung cancer Mother    Diabetes Father    Hyperlipidemia Father    Heart disease Father    Heart disease Maternal Grandmother    Breast cancer Maternal Grandmother    Heart disease Paternal Grandmother    Diabetes Paternal Grandmother    Heart disease Paternal Grandfather     Social History   Socioeconomic History   Marital status: Married    Spouse name: Not on file   Number of children: Not on file   Years of education:  Not on file   Highest education level: Not on file  Occupational History   Not on file  Tobacco Use   Smoking status: Never   Smokeless tobacco: Never  Vaping Use   Vaping status: Never Used  Substance and Sexual Activity   Alcohol use: Yes    Comment: occasional,none last 24 hrs   Drug use: No   Sexual activity: Not on file  Other Topics Concern   Not on file  Social History Narrative   Not on file   Social Drivers of Health   Financial Resource Strain: Not on file  Food Insecurity: Not on file  Transportation Needs: Not on file  Physical Activity: Not on file  Stress: Not on file  Social Connections: Not on file  Intimate Partner Violence: Not on file     Constitutional: Denies fever, malaise, fatigue, headache or abrupt weight changes.  HEENT: Denies eye pain, eye redness, ear pain, ringing in the ears, wax buildup, runny nose, nasal congestion, bloody nose, or sore throat. Respiratory: Denies difficulty breathing, shortness of breath, cough or sputum production.   Cardiovascular: Denies chest pain, chest tightness, palpitations or swelling in the hands or feet.  Gastrointestinal: Denies abdominal pain, bloating, constipation, diarrhea or blood in the stool.  GU:  Patient reports urinary frequency.  Denies urgency, pain with urination, burning sensation, blood in urine, odor or discharge. Musculoskeletal: Denies decrease in range of motion, difficulty with gait, muscle pain or joint pain or swelling.  Skin: Denies redness, rashes, lesions or ulcercations.  Neurological: Denies dizziness, difficulty with memory, difficulty with speech or problems with balance and coordination.  Psych: Denies anxiety, depression, SI/HI.  No other specific complaints in a complete review of systems (except as listed in HPI above).  Objective:   Physical Exam  BP 132/84 (BP Location: Left Arm, Patient Position: Sitting, Cuff Size: Normal)   Ht 5' 8 (1.727 m)   Wt 205 lb 12.8 oz (93.4  kg)   BMI 31.29 kg/m    Wt Readings from Last 3 Encounters:  08/24/23 203 lb 6.4 oz (92.3 kg)  09/28/22 221 lb 5.5 oz (100.4 kg)  01/24/22 208 lb (94.3 kg)    General: Appears his stated age,obese, in NAD. Skin: Warm, dry and intact.  HEENT: Head: normal shape and size; Eyes: sclera white, no icterus, conjunctiva pink, PERRLA and EOMs intact;  Neck:  Neck supple, trachea midline. No masses, lumps or thyromegaly present.  Cardiovascular: Normal rate and rhythm. S1,S2 noted.  No murmur, rubs or gallops noted. No JVD or BLE edema. No carotid bruits noted. Pulmonary/Chest: Normal effort and positive vesicular breath sounds. No respiratory distress. No wheezes, rales or ronchi noted.  Abdomen:  Normal bowel sounds.  Musculoskeletal: Strength 5/5 BUE/BLE.  No difficulty with gait.  Neurological: Alert and oriented. Cranial nerves II-XII grossly intact. Coordination normal.  Psychiatric: Mood and affect normal. Behavior is normal. Judgment and thought content normal.    BMET    Component Value Date/Time   NA 139 08/18/2021 1526   K 4.3 08/18/2021 1526   CL 104 08/18/2021 1526   CO2 25 08/18/2021 1526   GLUCOSE 80 08/18/2021 1526   BUN 17 08/18/2021 1526   CREATININE 0.98 08/18/2021 1526   CALCIUM  9.4 08/18/2021 1526   GFRNONAA 82 07/20/2020 0812   GFRAA 95 07/20/2020 0812    Lipid Panel     Component Value Date/Time   CHOL 224 (H) 08/18/2021 1526   TRIG 137 08/18/2021 1526   HDL 44 08/18/2021 1526   CHOLHDL 5.1 (H) 08/18/2021 1526   VLDL 36.6 05/12/2019 1614   LDLCALC 154 (H) 08/18/2021 1526    CBC    Component Value Date/Time   WBC 6.8 08/18/2021 1526   RBC 5.34 08/18/2021 1526   HGB 16.5 08/18/2021 1526   HCT 48.1 08/18/2021 1526   PLT 209 08/18/2021 1526   MCV 90.1 08/18/2021 1526   MCH 30.9 08/18/2021 1526   MCHC 34.3 08/18/2021 1526   RDW 12.6 08/18/2021 1526    Hgb A1C Lab Results  Component Value Date   HGBA1C 5.2 07/20/2020          Assessment  & Plan:   Preventative Health Maintenance:  Flu shot today Tetanus UTD Encouraged him to get a COVID-vaccine Colon screening UTD Encouraged him to consume a balanced diet and exercise regimen Advised him to see an eye doctor and dentist annually We will check CBC, c-Met, lipid , PSA today  RTC in 6 months, follow-up chronic conditions Angeline Laura, NP

## 2023-10-22 NOTE — Patient Instructions (Signed)
 Health Maintenance, Male  Adopting a healthy lifestyle and getting preventive care are important in promoting health and wellness. Ask your health care provider about:  The right schedule for you to have regular tests and exams.  Things you can do on your own to prevent diseases and keep yourself healthy.  What should I know about diet, weight, and exercise?  Eat a healthy diet    Eat a diet that includes plenty of vegetables, fruits, low-fat dairy products, and lean protein.  Do not eat a lot of foods that are high in solid fats, added sugars, or sodium.  Maintain a healthy weight  Body mass index (BMI) is a measurement that can be used to identify possible weight problems. It estimates body fat based on height and weight. Your health care provider can help determine your BMI and help you achieve or maintain a healthy weight.  Get regular exercise  Get regular exercise. This is one of the most important things you can do for your health. Most adults should:  Exercise for at least 150 minutes each week. The exercise should increase your heart rate and make you sweat (moderate-intensity exercise).  Do strengthening exercises at least twice a week. This is in addition to the moderate-intensity exercise.  Spend less time sitting. Even light physical activity can be beneficial.  Watch cholesterol and blood lipids  Have your blood tested for lipids and cholesterol at 54 years of age, then have this test every 5 years.  You may need to have your cholesterol levels checked more often if:  Your lipid or cholesterol levels are high.  You are older than 54 years of age.  You are at high risk for heart disease.  What should I know about cancer screening?  Many types of cancers can be detected early and may often be prevented. Depending on your health history and family history, you may need to have cancer screening at various ages. This may include screening for:  Colorectal cancer.  Prostate cancer.  Skin cancer.  Lung  cancer.  What should I know about heart disease, diabetes, and high blood pressure?  Blood pressure and heart disease  High blood pressure causes heart disease and increases the risk of stroke. This is more likely to develop in people who have high blood pressure readings or are overweight.  Talk with your health care provider about your target blood pressure readings.  Have your blood pressure checked:  Every 3-5 years if you are 24-52 years of age.  Every year if you are 3 years old or older.  If you are between the ages of 60 and 72 and are a current or former smoker, ask your health care provider if you should have a one-time screening for abdominal aortic aneurysm (AAA).  Diabetes  Have regular diabetes screenings. This checks your fasting blood sugar level. Have the screening done:  Once every three years after age 66 if you are at a normal weight and have a low risk for diabetes.  More often and at a younger age if you are overweight or have a high risk for diabetes.  What should I know about preventing infection?  Hepatitis B  If you have a higher risk for hepatitis B, you should be screened for this virus. Talk with your health care provider to find out if you are at risk for hepatitis B infection.  Hepatitis C  Blood testing is recommended for:  Everyone born from [birth year] through 1966.  Anyone  with known risk factors for hepatitis C.  Sexually transmitted infections (STIs)  You should be screened each year for STIs, including gonorrhea and chlamydia, if:  You are sexually active and are younger than 53 years of age.  You are older than 54 years of age and your health care provider tells you that you are at risk for this type of infection.  Your sexual activity has changed since you were last screened, and you are at increased risk for chlamydia or gonorrhea. Ask your health care provider if you are at risk.  Ask your health care provider about whether you are at high risk for HIV. Your health care provider  may recommend a prescription medicine to help prevent HIV infection. If you choose to take medicine to prevent HIV, you should first get tested for HIV. You should then be tested every 3 months for as long as you are taking the medicine.  Follow these instructions at home:  Alcohol use  Do not drink alcohol if your health care provider tells you not to drink.  If you drink alcohol:  Limit how much you have to 0-2 drinks a day.  Know how much alcohol is in your drink. In the [place] one drink equals one 12 oz bottle of beer (355 mL), one 5 oz glass of wine (148 mL), or one 1 oz glass of hard liquor (44 mL).  Lifestyle  Do not use any products that contain nicotine or tobacco. These products include cigarettes, chewing tobacco, and vaping devices, such as e-cigarettes. If you need help quitting, ask your health care provider.  Do not use street drugs.  Do not share needles.  Ask your health care provider for help if you need support or information about quitting drugs.  General instructions  Schedule regular health, dental, and eye exams.  Stay current with your vaccines.  Tell your health care provider if:  You often feel depressed.  You have ever been abused or do not feel safe at home.  Summary  Adopting a healthy lifestyle and getting preventive care are important in promoting health and wellness.  Follow your health care provider's instructions about healthy diet, exercising, and getting tested or screened for diseases.  Follow your health care provider's instructions on monitoring your cholesterol and blood pressure.  This information is not intended to replace advice given to you by your health care provider. Make sure you discuss any questions you have with your health care provider.  Document Revised: 05/24/2020 Document Reviewed: 05/24/2020  Elsevier Patient Education  2024 [employer].

## 2023-10-22 NOTE — Assessment & Plan Note (Signed)
 Encouraged diet and exercise for weight loss ?

## 2023-10-23 ENCOUNTER — Ambulatory Visit: Payer: Self-pay | Admitting: Internal Medicine

## 2023-10-26 MED ORDER — ROSUVASTATIN CALCIUM 10 MG PO TABS: 10.0000 mg | ORAL_TABLET | Freq: Every day | ORAL | 0 refills | Status: DC

## 2023-11-01 NOTE — Addendum Note (Signed)
 Addended by: ANTONETTE ANGELINE ORN on: 11/01/2023 02:33 PM   Modules accepted: Level of Service

## 2024-01-14 ENCOUNTER — Encounter: Payer: Self-pay | Admitting: Internal Medicine

## 2024-01-17 NOTE — Progress Notes (Unsigned)
 "  Subjective:    Patient ID: Joseph Salazar, male    DOB: 04-Feb-1970, 54 y.o.   MRN: [id number]  HPI  Discussed the use of AI scribe software for clinical note transcription with the patient, who gave verbal consent to proceed.  Joseph Salazar is a 54 year old male who presents with left shoulder pain.  He has been experiencing left shoulder pain for several months, which has gradually worsened. The pain is described as sore and achy, particularly at night and upon waking. Overextension of the shoulder results in sharp pain, though not severe enough to feel like a 'rip' or 'tear'.  He denies any numbness, tingling, or weakness in the left shoulder. There is no swelling noted. He is right-handed and does not engage in repetitive arm activities at work that could contribute to the pain. There is no history of prior injury to the shoulder.  He has been taking Aleve for the pain, which provides partial relief. He avoids movements that exacerbate the pain and experiences tightness and limitations, particularly when reaching up or across his body.  During the review of symptoms, no neck pain is reported. He feels tightness on the inside of the shoulder. He is more aware of the shoulder due to the discomfort.       Review of Systems     Past Medical History:  Diagnosis Date   Herpes, genital    History of chicken pox    Hypercholesteremia     Current Outpatient Medications  Medication Sig Dispense Refill   Multiple Vitamins-Minerals (MULTIVITAMIN ADULT PO) Take 1 tablet by mouth daily.     rosuvastatin  (CRESTOR ) 10 MG tablet Take 1 tablet (10 mg total) by mouth daily. 90 tablet 0   tamsulosin  (FLOMAX ) 0.4 MG CAPS capsule Take 1 capsule (0.4 mg total) by mouth daily. 90 capsule 1   No current facility-administered medications for this visit.    Allergies  Allergen Reactions   Penicillins Anaphylaxis    Family History  Problem Relation Age of Onset   Lung cancer  Mother    Diabetes Father    Hyperlipidemia Father    Heart disease Father    Heart disease Maternal Grandmother    Breast cancer Maternal Grandmother    Heart disease Paternal Grandmother    Diabetes Paternal Grandmother    Heart disease Paternal Grandfather     Social History   Socioeconomic History   Marital status: Married    Spouse name: Not on file   Number of children: Not on file   Years of education: Not on file   Highest education level: Not on file  Occupational History   Not on file  Tobacco Use   Smoking status: Never   Smokeless tobacco: Never  Vaping Use   Vaping status: Never Used  Substance and Sexual Activity   Alcohol use: Yes    Comment: occasional,none last 24 hrs   Drug use: No   Sexual activity: Not on file  Other Topics Concern   Not on file  Social History Narrative   Not on file   Social Drivers of Health   Tobacco Use: Low Risk (10/22/2023)   Patient History    Smoking Tobacco Use: Never    Smokeless Tobacco Use: Never    Passive Exposure: Not on file  Financial Resource Strain: Not on file  Food Insecurity: Not on file  Transportation Needs: Not on file  Physical Activity: Not on file  Stress: Not  on file  Social Connections: Not on file  Intimate Partner Violence: Not on file  Depression [phone]): Low Risk (10/22/2023)   Depression (PHQ2-9)    PHQ-2 Score: 1  Alcohol Screen: Low Risk (05/16/2021)   Alcohol Screen    Last Alcohol Screening Score (AUDIT): 3  Housing: Not on file  Utilities: Not on file  Health Literacy: Not on file     Constitutional: Denies fever, malaise, fatigue, headache or abrupt weight changes.  Respiratory: Denies difficulty breathing, shortness of breath, cough or sputum production.   Cardiovascular: Denies chest pain, chest tightness, palpitations or swelling in the hands or feet.  Musculoskeletal: Pt reports left shoulder pain. Denies decrease in range of motion, difficulty with gait, muscle pain or  joint swelling.  Skin: Denies redness, rashes, lesions or ulcercations.  Neurological: Denies numbness, tingling, weakness or problems with balance and coordination.    No other specific complaints in a complete review of systems (except as listed in HPI above).  Objective:   Physical Exam BP 124/82 (BP Location: Right Arm, Patient Position: Sitting, Cuff Size: Normal)   Ht 5' 8 (1.727 m)   Wt 210 lb 12.8 oz (95.6 kg)   BMI 32.05 kg/m     Wt Readings from Last 3 Encounters:  10/22/23 205 lb 12.8 oz (93.4 kg)  08/24/23 203 lb 6.4 oz (92.3 kg)  09/28/22 221 lb 5.5 oz (100.4 kg)    General: Appears his stated age, obese, in NAD. Cardiovascular: Normal rate and rhythm. S1,S2 noted.  No murmur, rubs or gallops noted. Radial pulse 2= on the left. Pulmonary/Chest: Normal effort and positive vesicular breath sounds. No respiratory distress. No wheezes, rales or ronchi noted.  Musculoskeletal: Normal abduction, adduction of the left shoulder.  Decreased external rotation of the shoulder as well as internal rotation of the shoulder.  Shoulder shrug equal.  No pain with palp patient of the shoulder.  Positive drop can test on the left.  Strength 5/5 BUE.  Handgrips equal.  No difficulty with gait.  Neurological: Alert and oriented.  Coordination normal.    BMET    Component Value Date/Time   NA 139 10/22/2023 1015   K 4.3 10/22/2023 1015   CL 104 10/22/2023 1015   CO2 28 10/22/2023 1015   GLUCOSE 80 10/22/2023 1015   BUN 13 10/22/2023 1015   CREATININE 0.98 10/22/2023 1015   CALCIUM  9.3 10/22/2023 1015   GFRNONAA 82 07/20/2020 0812   GFRAA 95 07/20/2020 0812    Lipid Panel     Component Value Date/Time   CHOL 321 (H) 10/22/2023 1015   TRIG 139 10/22/2023 1015   HDL 47 10/22/2023 1015   CHOLHDL 6.8 (H) 10/22/2023 1015   VLDL 36.6 05/12/2019 1614   LDLCALC 245 (H) 10/22/2023 1015    CBC    Component Value Date/Time   WBC 6.0 10/22/2023 1015   RBC 5.26 10/22/2023 1015    HGB 16.2 10/22/2023 1015   HCT 47.9 10/22/2023 1015   PLT 242 10/22/2023 1015   MCV 91.1 10/22/2023 1015   MCH 30.8 10/22/2023 1015   MCHC 33.8 10/22/2023 1015   RDW 13.2 10/22/2023 1015    Hgb A1C Lab Results  Component Value Date   HGBA1C 5.2 10/22/2023          Assessment & Plan:  Assessment and Plan    Chronic left shoulder pain Chronic left shoulder pain with positive drop can test. Likely tendinopathy without history of injury, making tear less likely. -  Prescribed 9-day 10 mg prednisone  taper. - Advised against repetitive use of the left arm. - Recommended icing and stretching exercises. - Provided shoulder exercises. - Consider x-ray and physical therapy if no improvement.        RTC in 4 months, follow-up chronic conditions Angeline Laura, NP  "

## 2024-01-18 ENCOUNTER — Encounter: Payer: Self-pay | Admitting: Internal Medicine

## 2024-01-18 ENCOUNTER — Ambulatory Visit: Admitting: Internal Medicine

## 2024-01-18 VITALS — BP 124/82 | Ht 68.0 in | Wt 210.8 lb

## 2024-01-18 DIAGNOSIS — M25512 Pain in left shoulder: Secondary | ICD-10-CM | POA: Diagnosis not present

## 2024-01-18 DIAGNOSIS — G8929 Other chronic pain: Secondary | ICD-10-CM | POA: Diagnosis not present

## 2024-01-18 MED ORDER — PREDNISONE 10 MG PO TABS: ORAL_TABLET | ORAL | 0 refills | Status: AC

## 2024-01-18 NOTE — Patient Instructions (Signed)

## 2024-01-24 ENCOUNTER — Other Ambulatory Visit: Payer: Self-pay | Admitting: Internal Medicine

## 2024-01-24 NOTE — Telephone Encounter (Signed)
 Requested Prescriptions  Pending Prescriptions Disp Refills   rosuvastatin  (CRESTOR ) 10 MG tablet [Pharmacy Med Name: ROSUVASTATIN  CALCIUM  10 MG TAB] 90 tablet 2    Sig: TAKE 1 TABLET BY MOUTH EVERY DAY     Cardiovascular:  Antilipid - Statins 2 Failed - 01/24/2024  3:37 PM      Failed - Lipid Panel in normal range within the last 12 months    Cholesterol  Date Value Ref Range Status  10/22/2023 321 (H) <200 mg/dL Final   LDL Cholesterol (Calc)  Date Value Ref Range Status  10/22/2023 245 (H) mg/dL (calc) Final    Comment:    LDL-C levels > or = 190 mg/dL may indicate familial  hypercholesterolemia (FH). Clinical assessment and  measurement of blood lipid levels should be  considered for all first degree relatives of patients with an FH diagnosis. LDL Cholesterol (LDL-C) levels > or = 300 mg/dL may indicate homozygous familial hypercholesterolemia (HoFH). Untreated,  these extremely high LDL-C levels can result in premature CV events and mortality. Patients should be identified early and provided appropriate interventions to reduce the cumulative LDL-C burden from birth. . For questions about testing for familial hypercholesterolemia, please call [employer] at [email or web address].GENE.INFO. SABRA Veatrice DASEN, et al. J National Lipid Association  Recommendations for Patient-Centered Management of Dyslipidemia: Part 1 Journal of  Clinical Lipidology 2015;9(2), 129-169. Cuchel, M. et al. (2014). Homozygous familial hypercholesterolaemia: new insights and guidance for clinicians to improve detection and clinical management. European Heart Journal, 35(32), [phone]. Reference range: <100 . Desirable range <100 mg/dL for primary prevention;   <70 mg/dL for patients with CHD or diabetic patients  with > or = 2 CHD risk factors. SABRA LDL-C is now calculated using the Martin-Hopkins  calculation, which is a validated novel method providing  better accuracy than the Friedewald  equation in the  estimation of LDL-C.  Gladis APPLETHWAITE et al. SANDREA. 7986;689(80): 2061-2068  (http://education.[facility].com/faq/FAQ164)    HDL  Date Value Ref Range Status  10/22/2023 47 > OR = 40 mg/dL Final   Triglycerides  Date Value Ref Range Status  10/22/2023 139 <150 mg/dL Final         Passed - Cr in normal range and within 360 days    Creat  Date Value Ref Range Status  10/22/2023 0.98 0.70 - 1.30 mg/dL Final         Passed - Patient is not pregnant      Passed - Valid encounter within last 12 months    Recent Outpatient Visits           6 days ago Chronic left shoulder pain   [place] [facility] [place], Angeline ORN, NP   3 months ago Influenza vaccine administered   [place] [facility] [place], Angeline ORN, NP   5 months ago Contact dermatitis due to plant   [facility] [place], Angeline ORN, NP

## 2024-02-13 ENCOUNTER — Other Ambulatory Visit: Payer: Self-pay | Admitting: Internal Medicine

## 2024-02-14 NOTE — Telephone Encounter (Signed)
 Requested medications are due for refill today.  unsure  Requested medications are on the active medications list.  yes  Last refill. 01/18/2024 #18 0 rf  Future visit scheduled.   yes  Notes to clinic.  Refill not delegated.     Requested Prescriptions  Pending Prescriptions Disp Refills   predniSONE  (DELTASONE ) 10 MG tablet [Pharmacy Med Name: PREDNISONE  10 MG TABLET] 18 tablet 0    Sig: Take 3 tabs on days 1-3, 2 tabs on days 4-6, 1 tab on days 7-9     Not Delegated - Endocrinology:  Oral Corticosteroids Failed - 02/14/2024 11:38 AM      Failed - This refill cannot be delegated      Failed - Manual Review: Eye exam for IOP if prolonged treatment      Failed - Bone Mineral Density or Dexa Scan completed in the last 2 years      Passed - Glucose (serum) in normal range and within 180 days    Glucose, Bld  Date Value Ref Range Status  10/22/2023 80 65 - 99 mg/dL Final    Comment:    .            Fasting reference interval .          Passed - K in normal range and within 180 days    Potassium  Date Value Ref Range Status  10/22/2023 4.3 3.5 - 5.3 mmol/L Final         Passed - Na in normal range and within 180 days    Sodium  Date Value Ref Range Status  10/22/2023 139 135 - 146 mmol/L Final         Passed - Last BP in normal range    BP Readings from Last 1 Encounters:  01/18/24 124/82         Passed - Valid encounter within last 6 months    Recent Outpatient Visits           3 weeks ago Chronic left shoulder pain   [place] [facility] [place], Angeline ORN, NP   3 months ago Influenza vaccine administered   [place] [facility] [place], Angeline ORN, NP   5 months ago Contact dermatitis due to plant   [facility] [place], Angeline ORN, NP

## 2024-04-21 ENCOUNTER — Ambulatory Visit: Admitting: Internal Medicine
